# Patient Record
Sex: Female | Born: 1980 | Hispanic: Yes | Marital: Married | Smoking: Never smoker
Health system: Southern US, Community
[De-identification: ages and names within clinical notes are randomized; demographics above are authoritative.]

## PROBLEM LIST (undated history)

## (undated) DIAGNOSIS — D649 Anemia, unspecified: Secondary | ICD-10-CM

## (undated) DIAGNOSIS — M810 Age-related osteoporosis without current pathological fracture: Secondary | ICD-10-CM

## (undated) DIAGNOSIS — D219 Benign neoplasm of connective and other soft tissue, unspecified: Secondary | ICD-10-CM

## (undated) DIAGNOSIS — H53149 Visual discomfort, unspecified: Secondary | ICD-10-CM

## (undated) DIAGNOSIS — R519 Headache, unspecified: Secondary | ICD-10-CM

## (undated) DIAGNOSIS — N83209 Unspecified ovarian cyst, unspecified side: Secondary | ICD-10-CM

## (undated) HISTORY — PX: TUBAL LIGATION: SHX77

## (undated) HISTORY — PX: ABDOMINAL SURGERY: SHX537

## (undated) HISTORY — DX: Anemia, unspecified: D64.9

## (undated) HISTORY — DX: Benign neoplasm of connective and other soft tissue, unspecified: D21.9

---

## 2014-06-10 ENCOUNTER — Emergency Department: Payer: Self-pay | Admitting: Emergency Medicine

## 2014-09-15 DIAGNOSIS — N644 Mastodynia: Secondary | ICD-10-CM | POA: Insufficient documentation

## 2014-10-03 ENCOUNTER — Emergency Department: Payer: Self-pay | Admitting: Emergency Medicine

## 2015-05-12 ENCOUNTER — Emergency Department: Payer: Medicaid Other

## 2015-05-12 ENCOUNTER — Emergency Department
Admission: EM | Admit: 2015-05-12 | Discharge: 2015-05-12 | Disposition: A | Discharge status: Home / Self Care | Payer: Medicaid Other | Attending: Emergency Medicine | Admitting: Emergency Medicine

## 2015-05-12 ENCOUNTER — Encounter: Payer: Self-pay | Admitting: Emergency Medicine

## 2015-05-12 DIAGNOSIS — Z3202 Encounter for pregnancy test, result negative: Secondary | ICD-10-CM | POA: Diagnosis not present

## 2015-05-12 DIAGNOSIS — R1011 Right upper quadrant pain: Secondary | ICD-10-CM

## 2015-05-12 DIAGNOSIS — R1084 Generalized abdominal pain: Secondary | ICD-10-CM

## 2015-05-12 DIAGNOSIS — N8320 Unspecified ovarian cysts: Secondary | ICD-10-CM | POA: Diagnosis not present

## 2015-05-12 DIAGNOSIS — N83209 Unspecified ovarian cyst, unspecified side: Secondary | ICD-10-CM

## 2015-05-12 LAB — URINALYSIS COMPLETE WITH MICROSCOPIC (ARMC ONLY)
Bilirubin Urine: NEGATIVE
Glucose, UA: NEGATIVE mg/dL
HGB URINE DIPSTICK: NEGATIVE
KETONES UR: NEGATIVE mg/dL
Leukocytes, UA: NEGATIVE
NITRITE: NEGATIVE
PH: 5 (ref 5.0–8.0)
PROTEIN: NEGATIVE mg/dL
SPECIFIC GRAVITY, URINE: 1.025 (ref 1.005–1.030)

## 2015-05-12 LAB — CBC WITH DIFFERENTIAL/PLATELET
Basophils Absolute: 0 10*3/uL (ref 0–0.1)
Basophils Relative: 1 %
EOS PCT: 3 %
Eosinophils Absolute: 0.1 10*3/uL (ref 0–0.7)
HEMATOCRIT: 32.4 % — AB (ref 35.0–47.0)
Hemoglobin: 10.2 g/dL — ABNORMAL LOW (ref 12.0–16.0)
LYMPHS ABS: 1 10*3/uL (ref 1.0–3.6)
Lymphocytes Relative: 31 %
MCH: 20.9 pg — AB (ref 26.0–34.0)
MCHC: 31.4 g/dL — ABNORMAL LOW (ref 32.0–36.0)
MCV: 66.6 fL — AB (ref 80.0–100.0)
Monocytes Absolute: 0.4 10*3/uL (ref 0.2–0.9)
Monocytes Relative: 11 %
NEUTROS PCT: 54 %
Neutro Abs: 1.9 10*3/uL (ref 1.4–6.5)
Platelets: 168 10*3/uL (ref 150–440)
RBC: 4.86 MIL/uL (ref 3.80–5.20)
RDW: 17.8 % — ABNORMAL HIGH (ref 11.5–14.5)
WBC: 3.4 10*3/uL — ABNORMAL LOW (ref 3.6–11.0)

## 2015-05-12 LAB — BASIC METABOLIC PANEL
ANION GAP: 8 (ref 5–15)
BUN: 18 mg/dL (ref 6–20)
CHLORIDE: 105 mmol/L (ref 101–111)
CO2: 27 mmol/L (ref 22–32)
CREATININE: 0.86 mg/dL (ref 0.44–1.00)
Calcium: 9.4 mg/dL (ref 8.9–10.3)
GFR calc non Af Amer: >60 mL/min (ref >60)
Glucose, Bld: 98 mg/dL (ref 65–99)
POTASSIUM: 3.6 mmol/L (ref 3.5–5.1)
Sodium: 140 mmol/L (ref 135–145)

## 2015-05-12 LAB — POCT PREGNANCY, URINE: Preg Test, Ur: NEGATIVE

## 2015-05-12 MED ORDER — HYDROCODONE-ACETAMINOPHEN 5-325 MG PO TABS: 1.0000 | ORAL_TABLET | ORAL | Status: DC | PRN

## 2015-05-12 MED ORDER — METOCLOPRAMIDE HCL 5 MG/ML IJ SOLN
10.0000 mg | Freq: Once | INTRAMUSCULAR | Status: AC
  Administered 2015-05-12: 10 mg via INTRAVENOUS

## 2015-05-12 MED ORDER — HYDROMORPHONE HCL 1 MG/ML IJ SOLN
0.5000 mg | INTRAMUSCULAR | Status: DC | PRN
  Administered 2015-05-12: 0.5 mg via INTRAVENOUS

## 2015-05-12 MED ORDER — IOHEXOL 300 MG/ML  SOLN
100.0000 mL | Freq: Once | INTRAMUSCULAR | Status: AC | PRN
  Administered 2015-05-12: 100 mL via INTRAVENOUS

## 2015-05-12 MED ORDER — METOCLOPRAMIDE HCL 5 MG/ML IJ SOLN
INTRAMUSCULAR | Status: AC
Start: 2015-05-12 — End: 2015-05-12
  Administered 2015-05-12: 10 mg via INTRAVENOUS
  Filled 2015-05-12: qty 2

## 2015-05-12 MED ORDER — IOHEXOL 240 MG/ML SOLN
25.0000 mL | Freq: Once | INTRAMUSCULAR | Status: AC | PRN
  Administered 2015-05-12: 25 mL via ORAL

## 2015-05-12 MED ORDER — HYDROMORPHONE HCL 1 MG/ML IJ SOLN
INTRAMUSCULAR | Status: AC
  Administered 2015-05-12: 0.5 mg via INTRAVENOUS
  Filled 2015-05-12: qty 1

## 2015-05-12 NOTE — ED Provider Notes (Signed)
[address] [facility] Emergency Department Provider Note  ____________________________________________  Time seen: 10:10 AM  I have reviewed the triage vital signs and the nursing notes.   HISTORY  Chief Complaint Abdominal Pain     HPI Jaclyn Jackson is a 34 y.o. female who began having abdominal pain last night at approximately 9 PM. She reports the pain as being moderate. It is primarily in the center of her abdomen. She has not had pain like this before. She denies any ongoing health issues. She does have a history of a C-section and some form of a procedure on her right ovary.   She denies nausea, vomiting, or diarrhea.   History reviewed. No pertinent past medical history.  There are no active problems to display for this patient.   Past Surgical History  Procedure Laterality Date  . Abdominal surgery    . Tubal ligation    . Cesarean section      Current Outpatient Rx  Name  Route  Sig  Dispense  Refill  . HYDROcodone-acetaminophen (NORCO) 5-325 MG per tablet   Oral   Take 1 tablet by mouth every 4 (four) hours as needed for moderate pain.   10 tablet   0     Allergies Review of patient's allergies indicates no known allergies.  No family history on file.  Social History History  Substance Use Topics  . Smoking status: Never Smoker   . Smokeless tobacco: Not on file  . Alcohol Use: No    Review of Systems  Constitutional: Negative for fever. ENT: Negative for sore throat. Cardiovascular: Negative for chest pain. Respiratory: Negative for shortness of breath. Gastrointestinal: Positive for abdominal pain without nausea vomiting or diarrhea. See history of present illness Genitourinary: Negative for dysuria. Musculoskeletal: No myalgias or injuries. Skin: Negative for rash. Neurological: Negative for headaches   10-point ROS otherwise negative.  ____________________________________________   PHYSICAL EXAM:  VITAL  SIGNS: ED Triage Vitals  Enc Vitals Group     BP 05/12/15 0931 113/78 mmHg     Pulse Rate 05/12/15 0931 71     Resp 05/12/15 0931 20     Temp 05/12/15 0931 98.5 F (36.9 C)     Temp Source 05/12/15 0931 Oral     SpO2 05/12/15 0931 100 %     Weight 05/12/15 0931 130 lb (58.968 kg)     Height 05/12/15 0931 5\' 2"  (1.575 m)     Head Cir --      Peak Flow --      Pain Score 05/12/15 0932 10     Pain Loc --      Pain Edu? --      Excl. in GC? --     Constitutional:  Alert and oriented. Well appearing, but uncomfortable on exam with abdominal tenderness. ENT   Head: Normocephalic and atraumatic.   Nose: No congestion/rhinnorhea.   Mouth/Throat: Mucous membranes are moist. Cardiovascular: Normal rate, regular rhythm, no murmur noted Respiratory:  Normal respiratory effort, no tachypnea.    Breath sounds are clear and equal bilaterally.  Gastrointestinal: Soft tenderness in the right upper quadrant, left upper quadrant, right lower quadrant, and mid abdomen. The left lower abdomen appears less tender. There is no distention. Back: No muscle spasm, no tenderness, no CVA tenderness. Musculoskeletal: No deformity noted. Nontender with normal range of motion in all extremities.  No noted edema. Neurologic:  Normal speech and language. No gross focal neurologic deficits are appreciated.  Skin:  Skin  is warm, dry. No rash noted. Psychiatric: Mood and affect are normal. Speech and behavior are normal.  ____________________________________________    LABS (pertinent positives/negatives)  Urine pregnancy: Negative  ____________________________________________  ____________________________________________    RADIOLOGY Abdominal x-ray No acute findings  Right upper quadrant ultrasound IMPRESSION: Study within normal limits.  CT abdomen and pelvis:  IMPRESSION: 1. Small volume of pelvic free fluid likely is physiologic, with evidence of a collapsing adnexal cyst on the  left. 2. Normal appendix and otherwise negative CT Abdomen and Pelvis.    ____________________________________________ ____________________________________________   INITIAL IMPRESSION / ASSESSMENT AND PLAN / ED COURSE  Pertinent labs & imaging results that were available during my care of the patient were reviewed by me and considered in my medical decision making (see chart for details).  Pleasant alert 34 year old female who speaks Spanish primarily. She has been having abdominal pain that began in this early a.m. The pain is diffuse but at least in the left lower quadrant.  An ultrasound is normal. Her plain abdominal x-ray was negative.  ----------------------------------------- 2:05 PM on 05/12/2015 -----------------------------------------  I have reexamined the patient and spoke with her with the hospital interpreter.  She continues to have some tenderness in her central abdomen, but at this reexam it is more notable that it's present in the right and not in the left. I will do a CT scan to evaluate for possible appendicitis.  ----------------------------------------- 3:38 PM on 05/12/2015 -----------------------------------------  CT scan shows some free fluid in the pelvis and a collapsed adnexal cyst on the left. The appendix was normal and the remainder of the CT was normal.  We will write a prescription for hydrocodone for the patient dispense #10. We are counseling her to follow-up with GYN for ongoing concerns.  ____________________________________________   FINAL CLINICAL IMPRESSION(S) / ED DIAGNOSES  Final diagnoses:  Abdominal pain, right upper quadrant  Generalized abdominal pain  Ruptured ovarian cyst      Darien Ramus, MD 05/12/15 [phone]

## 2015-05-12 NOTE — Discharge Instructions (Signed)
Quiste ovrico (Ovarian Cyst) Un quiste ovrico es una bolsa llena de lquido que se forma en el ovario. Los ovarios son los rganos pequeos que producen vulos en las mujeres. Se pueden formar varios tipos de quistes en los ovarios. La mayora no son cancerosos. Muchos de ellos no causan problemas y con frecuencia desaparecen solos. Algunos pueden provocar sntomas y requerir tratamiento. Los tipos ms comunes de quistes ovricos son los siguientes:  Quistes funcionales: estos quistes pueden aparecer todos los meses durante el ciclo menstrual. Esto es normal. Estos quistes suelen desaparecer con el prximo ciclo menstrual si la mujer no queda embarazada. En general, los quistes funcionales no tienen sntomas.  Endometriomas: estos quistes se forman a partir del tejido que recubre el tero. Tambin se denominan "quistes de chocolate" porque se llenan de sangre que se vuelve marrn. Este tipo de quiste puede provocar dolor en la zona inferior del abdomen durante la relacin sexual y con el perodo menstrual.  Cistoadenomas: este tipo se desarrolla a partir de las clulas que se ubican en el exterior del ovario. Estos quistes pueden ser muy grandes y causar dolor en la zona inferior del abdomen y durante la relacin sexual. Este tipo de quiste puede girar sobre s mismo, cortar el suministro de sangre y causar un dolor intenso. Tambin se puede romper con facilidad y provocar mucho dolor.  Quistes dermoides: este tipo de quiste a veces se encuentra en ambos ovarios. Estos quistes pueden contener diferentes tipos de tejidos del organismo, como piel, dientes, pelo o cartlago. Generalmente no tienen sntomas, a menos que sean muy grandes.  Quistes tecalutenicos: aparecen cuando se produce demasiada cantidad de cierta hormona (gonadotropina corinica humana) que estimula en exceso al ovario para que produzca vulos. Esto es ms frecuente despus de procedimientos que ayudan a la concepcin de un beb  (fertilizacin in vitro). CAUSAS   Los medicamentos para la fertilidad pueden provocar una afeccin mediante la cual se forman mltiples quistes de gran tamao en los ovarios. Esta se denomina sndrome de hiperestimulacin ovrica.  El sndrome del ovario poliqustico es una afeccin que puede causar desequilibrios hormonales, los cuales pueden dar como resultado quistes ovricos no funcionales. SIGNOS Y SNTOMAS  Muchos quistes ovricos no causan sntomas. Si se presentan sntomas, stos pueden ser:  Dolor o molestias en la pelvis.  Dolor en la parte baja del abdomen.  Dolor durante las relaciones sexuales.  Aumento del permetro abdominal (hinchazn).  Perodos menstruales anormales.  Aumento del dolor en los perodos menstruales.  Cese de los perodos menstruales sin estar embarazada. DIAGNSTICO  Estos quistes se descubren comnmente durante un examen de rutina o una exploracin ginecolgica anual. Es posible que se ordenen otros estudios para obtener ms informacin sobre el quiste. Estos estudios pueden ser:  Ecografas.  Radiografas de la pelvis.  Tomografa computada.  Resonancia magntica.  Anlisis de sangre. TRATAMIENTO  Muchos de los quistes ovricos desaparecen por s solos, sin tratamiento. Es probable que el mdico quiera controlar el quiste regularmente durante 2 o 3meses para ver si se produce algn cambio. En el caso de las mujeres en la menopausia, es particularmente importante controlar de cerca al quiste ya que el ndice de cncer de ovario en las mujeres menopusicas es ms alto. Cuando se requiere tratamiento, este puede incluir cualquiera de los siguientes:  Un procedimiento para drenar el quiste (aspiracin). Esto se puede realizar mediante el uso de una aguja grande y una ecografa. Tambin se puede hacer a travs de un procedimiento laparoscpico, En   este procedimiento, se inserta un tubo delgado que emite luz y que tiene una pequea cmara en un  extremo (laparoscopio) a travs de una pequea incisin.  Ciruga para extirpar el quiste completo. Esto se puede realizar mediante una ciruga laparoscpica o una ciruga abierta, la cual implica realizar una incisin ms grande en la parte inferior del abdomen.  Tratamiento hormonal o pldoras anticonceptivas. Estos mtodos a veces se usan para ayudar a disolver un quiste. INSTRUCCIONES PARA EL CUIDADO EN EL HOGAR   Tome solo medicamentos de venta libre o recetados, segn las indicaciones del mdico.  Concurra a las consultas de control con su mdico segn las indicaciones.  Hgase exmenes plvicos regulares y pruebas de Papanicolaou. SOLICITE ATENCIN MDICA SI:   Los perodos se atrasan, son irregulares, dolorosos o cesan.  El dolor plvico o abdominal no desaparece.  El abdomen se agranda o se hincha.  Siente presin en la vejiga o no puede vaciarla completamente.  Siente dolor durante las relaciones sexuales.  Tiene una sensacin de hinchazn, presin o molestias en el estmago.  Pierde peso sin razn aparente.  Siente un malestar generalizado.  Est estreida.  Pierde el apetito.  Le aparece acn.  Nota un aumento del vello corporal y facial.  Aumenta de peso sin hacer modificaciones en su actividad fsica y en su dieta habitual.  Sospecha que est embarazada. SOLICITE ATENCIN MDICA DE INMEDIATO SI:   Siente cada vez ms dolor abdominal.  Tiene malestar estomacal (nuseas) y vomita.  Tiene fiebre que se presenta de manera repentina.  Siente dolor abdominal al defecar.  Sus perodos menstruales son ms abundantes que lo habitual. ASEGRESE DE QUE:   Comprende estas instrucciones.  Controlar su afeccin.  Recibir ayuda de inmediato si no mejora o si empeora. Document Released: 08/02/2005 Document Revised: 10/28/2013 [employer] Patient Information 2015 [employer]. This information is not intended to replace advice given to you by your health  care provider. Make sure you discuss any questions you have with your health care provider.  

## 2015-05-12 NOTE — ED Notes (Signed)
Patient returned to room. 

## 2015-05-12 NOTE — ED Notes (Signed)
Patient transported to Ultrasound 

## 2015-05-12 NOTE — ED Notes (Signed)
Pt presents with abd pain that started last night. Denies any n/v/d.

## 2015-06-07 ENCOUNTER — Emergency Department: Payer: Medicaid Other

## 2015-06-07 ENCOUNTER — Emergency Department
Admission: EM | Admit: 2015-06-07 | Discharge: 2015-06-07 | Disposition: A | Discharge status: Home / Self Care | Payer: Medicaid Other | Attending: Emergency Medicine | Admitting: Emergency Medicine

## 2015-06-07 ENCOUNTER — Encounter: Payer: Self-pay | Admitting: *Deleted

## 2015-06-07 DIAGNOSIS — R102 Pelvic and perineal pain: Secondary | ICD-10-CM

## 2015-06-07 DIAGNOSIS — N739 Female pelvic inflammatory disease, unspecified: Secondary | ICD-10-CM | POA: Diagnosis not present

## 2015-06-07 DIAGNOSIS — Z3202 Encounter for pregnancy test, result negative: Secondary | ICD-10-CM | POA: Diagnosis not present

## 2015-06-07 DIAGNOSIS — R1032 Left lower quadrant pain: Secondary | ICD-10-CM | POA: Diagnosis present

## 2015-06-07 LAB — CBC WITH DIFFERENTIAL/PLATELET
BASOS ABS: 0 10*3/uL (ref 0–0.1)
BASOS PCT: 1 %
Eosinophils Absolute: 0.1 10*3/uL (ref 0–0.7)
Eosinophils Relative: 3 %
HCT: 32.1 % — ABNORMAL LOW (ref 35.0–47.0)
Hemoglobin: 10.1 g/dL — ABNORMAL LOW (ref 12.0–16.0)
LYMPHS PCT: 25 %
Lymphs Abs: 1.2 10*3/uL (ref 1.0–3.6)
MCH: 21 pg — ABNORMAL LOW (ref 26.0–34.0)
MCHC: 31.4 g/dL — ABNORMAL LOW (ref 32.0–36.0)
MCV: 66.8 fL — ABNORMAL LOW (ref 80.0–100.0)
Monocytes Absolute: 0.6 10*3/uL (ref 0.2–0.9)
Monocytes Relative: 12 %
NEUTROS PCT: 59 %
Neutro Abs: 2.8 10*3/uL (ref 1.4–6.5)
PLATELETS: 191 10*3/uL (ref 150–440)
RBC: 4.81 MIL/uL (ref 3.80–5.20)
RDW: 17.9 % — AB (ref 11.5–14.5)
WBC: 4.7 10*3/uL (ref 3.6–11.0)

## 2015-06-07 LAB — URINALYSIS COMPLETE WITH MICROSCOPIC (ARMC ONLY)
Bacteria, UA: NONE SEEN
Bilirubin Urine: NEGATIVE
Glucose, UA: NEGATIVE mg/dL
Hgb urine dipstick: NEGATIVE
Ketones, ur: NEGATIVE mg/dL
Leukocytes, UA: NEGATIVE
Nitrite: NEGATIVE
PROTEIN: NEGATIVE mg/dL
RBC / HPF: NONE SEEN RBC/hpf (ref 0–5)
Specific Gravity, Urine: 1.015 (ref 1.005–1.030)
pH: 5 (ref 5.0–8.0)

## 2015-06-07 LAB — WET PREP, GENITAL
Clue Cells Wet Prep HPF POC: NONE SEEN
Trich, Wet Prep: NONE SEEN
WBC, Wet Prep HPF POC: NONE SEEN
YEAST WET PREP: NONE SEEN

## 2015-06-07 LAB — COMPREHENSIVE METABOLIC PANEL
ALK PHOS: 43 U/L (ref 38–126)
ALT: 30 U/L (ref 14–54)
AST: 27 U/L (ref 15–41)
Albumin: 4.3 g/dL (ref 3.5–5.0)
Anion gap: 6 (ref 5–15)
BUN: 11 mg/dL (ref 6–20)
CALCIUM: 9.2 mg/dL (ref 8.9–10.3)
CHLORIDE: 108 mmol/L (ref 101–111)
CO2: 28 mmol/L (ref 22–32)
CREATININE: 0.76 mg/dL (ref 0.44–1.00)
Glucose, Bld: 81 mg/dL (ref 65–99)
Potassium: 3.4 mmol/L — ABNORMAL LOW (ref 3.5–5.1)
Sodium: 142 mmol/L (ref 135–145)
Total Bilirubin: 0.4 mg/dL (ref 0.3–1.2)
Total Protein: 7.4 g/dL (ref 6.5–8.1)

## 2015-06-07 LAB — CHLAMYDIA/NGC RT PCR (ARMC ONLY)
CHLAMYDIA TR: NOT DETECTED
N GONORRHOEAE: NOT DETECTED

## 2015-06-07 MED ORDER — AZITHROMYCIN 250 MG PO TABS
1000.0000 mg | ORAL_TABLET | Freq: Once | ORAL | Status: AC
  Administered 2015-06-07: 1000 mg via ORAL
  Filled 2015-06-07: qty 4

## 2015-06-07 MED ORDER — OXYCODONE-ACETAMINOPHEN 5-325 MG PO TABS
2.0000 | ORAL_TABLET | Freq: Once | ORAL | Status: AC
  Administered 2015-06-07: 2 via ORAL
  Filled 2015-06-07: qty 2

## 2015-06-07 MED ORDER — METRONIDAZOLE 500 MG PO TABS: 500.0000 mg | ORAL_TABLET | Freq: Two times a day (BID) | ORAL | Status: DC

## 2015-06-07 MED ORDER — CEFTRIAXONE SODIUM 250 MG IJ SOLR
250.0000 mg | INTRAMUSCULAR | Status: DC
  Administered 2015-06-07: 250 mg via INTRAMUSCULAR
  Filled 2015-06-07: qty 250

## 2015-06-07 MED ORDER — DIPHENHYDRAMINE HCL 25 MG PO CAPS
ORAL_CAPSULE | ORAL | Status: AC
  Administered 2015-06-07: 25 mg via ORAL
  Filled 2015-06-07: qty 1

## 2015-06-07 MED ORDER — OXYCODONE-ACETAMINOPHEN 5-325 MG PO TABS: 1.0000 | ORAL_TABLET | Freq: Four times a day (QID) | ORAL | Status: DC | PRN

## 2015-06-07 MED ORDER — DIPHENHYDRAMINE HCL 25 MG PO CAPS
25.0000 mg | ORAL_CAPSULE | Freq: Once | ORAL | Status: AC
  Administered 2015-06-07: 25 mg via ORAL

## 2015-06-07 MED ORDER — DOXYCYCLINE HYCLATE 100 MG PO CAPS: 100.0000 mg | ORAL_CAPSULE | Freq: Two times a day (BID) | ORAL | Status: DC

## 2015-06-07 NOTE — ED Provider Notes (Signed)
[facility] Emergency Department Provider Note     Time seen: ----------------------------------------- 2:51 PM on 06/07/2015 -----------------------------------------    I have reviewed the triage vital signs and the nursing notes.   HISTORY  Chief Complaint Abdominal Pain    HPI Jaclyn Jackson is a 34 y.o. female who presents to ER for abdominal pain. Patient states she was seen earlier this month for the same thing. Pain is in the left lower quadrant, nothing makes it better or worse. Patient denies any fevers chills other complaints. Pain is sharp.   No past medical history on file.  There are no active problems to display for this patient.   Past Surgical History  Procedure Laterality Date  . Abdominal surgery    . Tubal ligation    . Cesarean section      Allergies Review of patient's allergies indicates no known allergies.  Social History History  Substance Use Topics  . Smoking status: Never Smoker   . Smokeless tobacco: Not on file  . Alcohol Use: No    Review of Systems Constitutional: Negative for fever. Eyes: Negative for visual changes. ENT: Negative for sore throat. Cardiovascular: Negative for chest pain. Respiratory: Negative for shortness of breath. Gastrointestinal: Positive for abdominal pain. Genitourinary: Negative for dysuria. Musculoskeletal: Negative for back pain. Skin: Negative for rash. Neurological: Negative for headaches, focal weakness or numbness.  10-point ROS otherwise negative.  ____________________________________________   PHYSICAL EXAM:  VITAL SIGNS: ED Triage Vitals  Enc Vitals Group     BP 06/07/15 1426 112/69 mmHg     Pulse Rate 06/07/15 1426 78     Resp 06/07/15 1426 22     Temp 06/07/15 1426 98.5 F (36.9 C)     Temp Source 06/07/15 1426 Oral     SpO2 06/07/15 1426 100 %     Weight 06/07/15 1426 128 lb (58.06 kg)     Height 06/07/15 1426  (1.575 m)     Head Cir --      Peak Flow --      Pain Score --      Pain Loc --      Pain Edu? --      Excl. in GC? --     Constitutional: Alert and oriented. Well appearing and in no distress. Eyes: Conjunctivae are normal. PERRL. Normal extraocular movements. ENT   Head: Normocephalic and atraumatic.   Nose: No congestion/rhinnorhea.   Mouth/Throat: Mucous membranes are moist.   Neck: No stridor. Cardiovascular: Normal rate, regular rhythm. Normal and symmetric distal pulses are present in all extremities. No murmurs, rubs, or gallops. Respiratory: Normal respiratory effort without tachypnea nor retractions. Breath sounds are clear and equal bilaterally. No wheezes/rales/rhonchi. Gastrointestinal: Positive for left lower quadrant abdominal pain, no rebound or guarding. Genitourinary: Copious thick white vaginal discharge, cervical motion tenderness and bilateral adnexal tenderness Musculoskeletal: Nontender with normal range of motion in all extremities. No joint effusions.  No lower extremity tenderness nor edema. Neurologic:  Normal speech and language. No gross focal neurologic deficits are appreciated. Speech is normal. No gait instability. Skin:  Skin is warm, dry and intact. No rash noted. Psychiatric: Mood and affect are normal. Speech and behavior are normal. Patient exhibits appropriate insight and judgment  ____________________________________________  ED COURSE:  Pertinent labs & imaging results that were available during my care of the patient were reviewed by me and considered in my medical decision making (see chart for details). Patient will need abdominal labs, pelvic  exam and likely ultrasound. ____________________________________________    LABS (pertinent positives/negatives)  Labs Reviewed  CBC WITH DIFFERENTIAL/PLATELET - Abnormal; Notable for the following:    Hemoglobin 10.1 (*)    HCT 32.1 (*)    MCV 66.8 (*)    MCH 21.0 (*)    MCHC 31.4 (*)    RDW 17.9 (*)    All  other components within normal limits  COMPREHENSIVE METABOLIC PANEL - Abnormal; Notable for the following:    Potassium 3.4 (*)    All other components within normal limits  URINALYSIS COMPLETEWITH MICROSCOPIC ([facility] ONLY) - Abnormal; Notable for the following:    Color, Urine YELLOW (*)    APPearance HAZY (*)    Squamous Epithelial / LPF 6-30 (*)    All other components within normal limits  CHLAMYDIA/NGC RT PCR ([facility] ONLY)  WET PREP, GENITAL  POC URINE PREG, ED    RADIOLOGY  Pelvic ultrasound IMPRESSION: 1. Normal sonographic appearance of the uterus. 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 2. Normal endometrium. 3. Nonvisualization of the right ovary. 4. Normal left ovary. 5. Small amount of free pelvic fluid.  ____________________________________________  FINAL ASSESSMENT AND PLAN  Pelvic pain, PID  Plan: Patient with labs and imaging as dictated above. Patient given Rocephin and Zithromax to cover for STDs. She'll be discharged with doxycycline and Flagyl and close follow-up with her doctor to ensure improvement.   Emily Filbert, MD   Emily Filbert, MD 06/07/15 [phone]

## 2015-06-07 NOTE — ED Notes (Signed)
Pt reports feeling itchy all over. MD notified.

## 2015-06-07 NOTE — ED Notes (Signed)
poct urine pregnancy test NEGATIVE 

## 2015-06-07 NOTE — Progress Notes (Signed)
   06/07/15 1500  Clinical Encounter Type  Visited With Patient  Visit Type Spiritual support  Spiritual Encounters  Spiritual Needs Emotional  Stress Factors  Patient Stress Factors Health changes   Status:  Faith: Catholic Family: none present, has 3 kids that are with a family friend Visit Assessment: The chaplain shared  encouraging words. The patient shared that her husband is working and she's having pain in her stomach. She said that she was cold but the nurse said she has a fever. The chaplain introduced pastoral care and the option to bring in a priest if need be as well as an interpreter but the patient did not need either, she said.  Pastoral Care: [phone] pager or submit an order online

## 2015-06-07 NOTE — ED Notes (Signed)
Pt has lower abd pain for 2 days.  No vag discharge or bleeding.  Pt has low back pain.  Denies dysuria.

## 2015-06-07 NOTE — Discharge Instructions (Signed)
Enfermedad inflamatoria plvica (Pelvic Inflammatory Disease)  La enfermedad inflamatoria plvica (PID) se refiere a una infeccin en algunos o todos de los rganos femeninos. La infeccin puede estar en el tero, en los ovarios, en las trompas de [place] o en los tejidos circundantes en la pelvis. La PID puede causar un dolor abdominal o plvico que aparece repentinamente (dolor plvico agudo). La PID es una infeccin grave, ya que puede conducir a un dolor plvico duradero (crnico) o a la imposibilidad de Best boy hijos (infertilidad).  CAUSAS  La infeccin generalmente es causada por las bacterias normales que se encuentran en los tejidos vaginales. Otras causas son las infecciones que se transmiten durante el contacto sexual. Tambin puede aparecer despus de:   El nacimiento de un beb.   Un aborto espontneo.   Un aborto inducido.   Una ciruga plvica mayor.   El uso de un dispositivo intrauterino (DIU).   Una violacin sexual.  FACTORES DE RIESGO  Ciertos factores pueden aumentar el riesgo de PID, por ejemplo:   Ser menor de 25 aos de edad.  Ser sexualmente activa a una edad temprana.  El uso de anticonceptivos que no son de barrera.  Tener mltiples compaeros sexuales.  Tener relaciones sexuales con alguien que tiene sntomas de una infeccin genital.  El uso de anticonceptivos orales. Otras veces, ciertos factores pueden aumentar la posibilidad de sufrir una PID, por ejemplo:   Tener relaciones sexuales durante la menstruacin.  Usar una ducha vaginal.  Tener un DIU (dispositivo intrauterino) en su lugar. SNTOMAS   Dolor abdominal o plvico   Fiebre.   Escalofros.   Flujo vaginal anormal.  Sangrado uterino anormal.   Dolor inusual poco despus de finalizado el perodo. DIAGNSTICO  El mdico decidir algunos de los siguientes mtodos para hacer el diagnstico:   Optometrist un examen fsico y Ardelia Mems historia clnica. El examen plvico muestra el  tero y esa zona de la pelvis muy sensibles.   Pruebas de laboratorio, como un test de [place], anlisis de [place] y de [place].  Cultivos de la vagina y del cuello uterino para Hydrographic surveyor una infeccin de transmisin sexual (ITS).  Realizar una ecografa.  Una laparoscopia para observar el interior de la pelvis.  TRATAMIENTO   Se pueden prescribir antibiticos por va oral.   Las parejas sexuales deben tratarse cuando la infeccin es [place] en una enfermedad de transmisin sexual (ETS).   Puede que necesite ser hospitalizada para aplicarle los antibiticos por va intravenosa.  En algunos casos poco frecuentes es necesaria la [place]. Pueden pasar semanas hasta la completa curacin. Si se le diagnostica una PID, tambin debern realizarle estudios para descartar el virus de inmunodeficiencia humana (VIH).  [place] los antibiticos como le indic el mdico, si se los receta. Finalice el [place], aunque comience a Sports administrator.   Slo tome medicamentos de venta libre o recetados para Glass blower/designer, las molestias o bajar la fiebre segn las indicaciones de su mdico.   No tenga relaciones sexuales hasta completar el [place], o segn las indicaciones del mdico. Si se confirma la PID, sus compaeros sexuales recientes Youth worker.   [place] con las citas tal como se le indic. SOLICITE ATENCIN MDICA SI:   Margette Fast secrecin vaginal anormal o abundante.   Necesita medicamentos recetados para Conservation officer, historic buildings.   [place].   No puede tomar los medicamentos.   Su pareja tiene una enfermedad de transmisin sexual.  Jenne Pane MDICA  DE INMEDIATO SI:   Tiene fiebre.   Aumenta el dolor abdominal o plvico.   Siente escalofros.   Siente dolor al [employer].   No mejora luego de 72 horas del tratamiento.  ASEGRESE DE QUE:   Comprende estas instrucciones.  Controlar su  enfermedad.  Solicitar ayuda de inmediato si no mejora o si empeora. Document Released: 08/02/2005 Document Revised: 02/17/2013 [facility] Patient Information 2015 [place], [place]. This information is not intended to replace advice given to you by your health care provider. Make sure you discuss any questions you have with your health care provider.  Dolor plvico  (Pelvic Pain)  Las causa del dolor plvico en la mujer pueden ser muchas y pueden tener su origen en diferentes lugares. El dolor plvico es el que aparece en la mitad inferior del abdomen y [place] caderas. Puede aparecer durante en un perodo corto de tiempo (agudo)o puede ser recurrente (crnico). Esta afeccin puede estar relacionada con trastornos que afectan a los rganos reproductivos femeninos (ginecolgica), pero tambin puede deberse a problemas en la vejiga, clculos renales, complicaciones intestinales, o problemas musculares o esquelticos. Es Garment/textile technologist ayuda de inmediato, sobre todo si ha sido intenso, [place], o ha aparecido de Wellsite geologist sbita como un dolor inusual. Tambin es importante obtener ayuda de inmediato, ya que algunos tipos de dolor plvico puede poner en peligro la vida.  CAUSAS  A continuacin veremos algunas de las causas del dolor plvico. Las causas pueden clasificarse de diferentes modos.   Ginecolgica.  Enfermedad inflamatoria plvica.  Infecciones de transmisin sexual.  Quiste de ovario o torsin de un ligamento ovrico ( torsin ovrica).  La membrana que recubre internamente al tero desarrollndose fuera del tero (endometriosis).  Fibromas, quistes o tumores.  Ovulacin.  Embarazo.  Embarazo fuera del tero (embarazo ectpico).  Aborto espontneo.  Trabajo de [place].  Desprendimiento de la placenta o ruptura del tero.  Infecciones.  Infeccin uterina (endometritis).  Infeccin de la vejiga.  Diverticulitis.  Aborto relacionado con una infeccin uterina (aborto  sptico).  Vejiga.  Inflamacin de la vejiga (cistitis).  Clculos renales.  Gastrointenstinal.  Estreimiento.  Diverticulitis.  Neurolgico.  Traumatismos.  Sentir dolor plvico debido a causas mentales o emocionales (psicosomtico).  Tumores en el intestino o en la pelvis. EVALUACIN  El mdico har una historia clnica detallada segn sus sntomas. Incluir los cambios recientes en su salud, una cuidadosa historia ginecolgica de sus periodos (menstruaciones) y Neomia Dear historia de su actividad sexual. Los antecedentes familiares y la historia clnica tambin son importantes. Su mdico podr indicar un examen plvico. El examen plvico ayudar a identificar la ubicacin y la gravedad del Engineer, mining. Tambin ayudar a [employer] rganos que pueden estar involucrados. Myrtha Mantis identificar la causa del dolor plvico y tratarlo adecuadamente, el mdico puede indicar estudios. Estas pruebas pueden ser:   Test de embarazo.  Ecografa plvica.  Radiografa del abdomen.  Un anlisis de [place] o la evaluacin de la secrecin vaginal.  Anlisis de [place]. INSTRUCCIONES PARA EL CUIDADO EN EL HOGAR   Solo tome medicamentos de venta libre o recetados para Chief Technology Officer, Dentist o fiebre, segn las indicaciones del mdico.   Haga reposo segn las indicaciones del mdico.   Consuma una dieta balanceada.   Beba gran cantidad de lquido para mantener la orina de tono claro o amarillo plido.   Evite las relaciones sexuales, Counsellor.   Aplique compresas calientes o fras en la zona baja del abdomen segn cual le calme el dolor.  Evite las situaciones estresantes.   Lleve un registro del dolor plvico. Anote cundo comenz, dnde se Optometrist y si hay cosas que parecen estar asociadas con el dolor, como algn alimento o su ciclo menstrual.  Concurra a las visitas de control con el mdico, segn las indicaciones.  SOLICITE ATENCIN MDICA SI:   Los medicamentos no  Editor, commissioning.  Tiene flujo vaginal anormal. SOLICITE ATENCIN MDICA DE INMEDIATO SI:   Tiene un sangrado abundante por la vagina.   El dolor plvico aumenta.   Se siente mareada o sufre un desmayo.   Siente escalofros.   Siente dolor intenso al Geographical information systems officer u observa sangre en la orina.   Tiene diarrea o vmitos que no puede controlar.   Tiene fiebre o sntomas que persisten durante ms de [address]  [address].  Tiene fiebre y los sntomas [address].   Ha sido abusada fsica o sexualmente.  ASEGRESE DE QUE:   Comprende estas instrucciones.  Controlar su enfermedad.  Solicitar ayuda de inmediato si no mejora o si empeora. Document Released: 01/19/2009 Document Revised: 03/09/2014 [facility] Patient Information 2015 [place], [place]. This information is not intended to replace advice given to you by your health care provider. Make sure you discuss any questions you have with your health care provider.

## 2015-06-08 LAB — POCT PREGNANCY, URINE: Preg Test, Ur: NEGATIVE

## 2015-06-17 ENCOUNTER — Other Ambulatory Visit: Payer: Self-pay | Admitting: Family Medicine

## 2015-06-17 DIAGNOSIS — N83209 Unspecified ovarian cyst, unspecified side: Secondary | ICD-10-CM

## 2018-02-11 ENCOUNTER — Emergency Department
Admission: EM | Admit: 2018-02-11 | Discharge: 2018-02-11 | Disposition: A | Discharge status: Home / Self Care | Payer: Commercial Managed Care - PPO | Attending: Emergency Medicine | Admitting: Emergency Medicine

## 2018-02-11 ENCOUNTER — Encounter: Payer: Self-pay | Admitting: Emergency Medicine

## 2018-02-11 ENCOUNTER — Emergency Department: Payer: Commercial Managed Care - PPO

## 2018-02-11 ENCOUNTER — Other Ambulatory Visit: Payer: Self-pay

## 2018-02-11 DIAGNOSIS — Z79899 Other long term (current) drug therapy: Secondary | ICD-10-CM | POA: Insufficient documentation

## 2018-02-11 DIAGNOSIS — G43001 Migraine without aura, not intractable, with status migrainosus: Secondary | ICD-10-CM | POA: Diagnosis not present

## 2018-02-11 DIAGNOSIS — R51 Headache: Secondary | ICD-10-CM | POA: Diagnosis present

## 2018-02-11 MED ORDER — SODIUM CHLORIDE 0.9 % IV BOLUS
1000.0000 mL | Freq: Once | INTRAVENOUS | Status: AC
  Administered 2018-02-11: 1000 mL via INTRAVENOUS

## 2018-02-11 MED ORDER — KETOROLAC TROMETHAMINE 30 MG/ML IJ SOLN
30.0000 mg | Freq: Once | INTRAMUSCULAR | Status: AC
  Administered 2018-02-11: 30 mg via INTRAVENOUS
  Filled 2018-02-11: qty 1

## 2018-02-11 MED ORDER — ONDANSETRON HCL 4 MG/2ML IJ SOLN
4.0000 mg | Freq: Once | INTRAMUSCULAR | Status: AC
  Administered 2018-02-11: 4 mg via INTRAVENOUS
  Filled 2018-02-11: qty 2

## 2018-02-11 MED ORDER — METOCLOPRAMIDE HCL 5 MG/ML IJ SOLN
20.0000 mg | Freq: Once | INTRAVENOUS | Status: AC
  Administered 2018-02-11: 20 mg via INTRAVENOUS
  Filled 2018-02-11: qty 4

## 2018-02-11 MED ORDER — BUTALBITAL-APAP-CAFFEINE 50-325-40 MG PO TABS: 1.0000 | ORAL_TABLET | Freq: Four times a day (QID) | ORAL | 0 refills | Status: AC | PRN

## 2018-02-11 MED ORDER — DIPHENHYDRAMINE HCL 50 MG/ML IJ SOLN
25.0000 mg | Freq: Once | INTRAMUSCULAR | Status: AC
  Administered 2018-02-11: 25 mg via INTRAVENOUS
  Filled 2018-02-11: qty 1

## 2018-02-11 NOTE — ED Triage Notes (Signed)
Headache since Saturday.  Worse than a usual headache

## 2018-02-11 NOTE — Discharge Instructions (Addendum)
Advised to follow-up family doctor for continued care.

## 2018-02-11 NOTE — ED Provider Notes (Signed)
[facility] Emergency Department Provider Note   ____________________________________________   First MD Initiated Contact with Patient 02/11/18 1328     (approximate)  I have reviewed the triage vital signs and the nursing notes.   HISTORY Via interpreter Chief Complaint Headache    HPI Jaclyn Jackson is a 37 y.o. female patient complained of severe headache which started 2 days ago.  Patient is a worse headache she hit her head.  Patient states she has a history of migraine 1-2 times a year.  She states this headache feels different.  She feels like needles pressure and fireworks going off in her head.  Patient rates the pain as a 10/10.  Patient states she does have photophobia and nausea.  Patient denies vertigo or weakness.  Patient had no relief with over-the-counter migraine preparations.  History reviewed. No pertinent past medical history.  There are no active problems to display for this patient.   Past Surgical History:  Procedure Laterality Date  . ABDOMINAL SURGERY    . CESAREAN SECTION    . TUBAL LIGATION      Prior to Admission medications   Medication Sig Start Date End Date Taking? Authorizing Provider  butalbital-acetaminophen-caffeine (FIORICET, ESGIC) 50-325-40 MG tablet Take 1-2 tablets by mouth every 6 (six) hours as needed for headache. 02/11/18 02/11/19  Joni ReiningSmith, Isobel Eisenhuth K, PA-C  doxycycline (VIBRAMYCIN) 100 MG capsule Take 1 capsule (100 mg total) by mouth 2 (two) times daily. 06/07/15   Emily FilbertWilliams, Jonathan E, MD  HYDROcodone-acetaminophen (NORCO) 5-325 MG per tablet Take 1 tablet by mouth every 4 (four) hours as needed for moderate pain. 05/12/15   Darien RamusKaminski, David W, MD  metroNIDAZOLE (FLAGYL) 500 MG tablet Take 1 tablet (500 mg total) by mouth 2 (two) times daily. 06/07/15   Emily FilbertWilliams, Jonathan E, MD  oxyCODONE-acetaminophen (ROXICET) 5-325 MG per tablet Take 1 tablet by mouth every 6 (six) hours as needed. 06/07/15   Emily FilbertWilliams, Jonathan E,  MD    Allergies Acetaminophen  No family history on file.  Social History Social History   Tobacco Use  . Smoking status: Never Smoker  . Smokeless tobacco: Never Used  Substance Use Topics  . Alcohol use: No  . Drug use: Not on file    Review of Systems Constitutional: No fever/chills Eyes: Light sensitivity.   ENT: No sore throat. Cardiovascular: Denies chest pain. Respiratory: Denies shortness of breath. Gastrointestinal: No abdominal pain.  Nausea without vomiting.  No diarrhea.  No constipation. Genitourinary: Negative for dysuria. Musculoskeletal: Negative for back pain. Skin: Negative for rash. Neurological: Positive for headaches, but denies focal weakness or numbness. ____________________________________________   PHYSICAL EXAM:  VITAL SIGNS: ED Triage Vitals  Enc Vitals Group     BP 02/11/18 1217 115/75     Pulse Rate 02/11/18 1217 67     Resp 02/11/18 1217 14     Temp 02/11/18 1217 98.2 F (36.8 C)     Temp Source 02/11/18 1217 Oral     SpO2 02/11/18 1217 100 %     Weight 02/11/18 1218 130 lb (59 kg)     Height 02/11/18 1218 5\' 2"  (1.575 m)     Head Circumference --      Peak Flow --      Pain Score --      Pain Loc --      Pain Edu? --      Excl. in GC? --    Constitutional: Alert and oriented. Well appearing and  in no acute distress. Eyes:  exam limited secondary to photophobia..   Head: Atraumatic. Hematological/Lymphatic/Immunilogical: No cervical lymphadenopathy. Cardiovascular: Normal rate, regular rhythm. Grossly normal heart sounds.  Good peripheral circulation. Respiratory: Normal respiratory effort.  No retractions. Lungs CTAB. eurologic:  Normal speech and language. No gross focal neurologic deficits are appreciated. No gait instability. Skin:  Skin is warm, dry and intact. No rash noted. Psychiatric: Mood and affect are normal. Speech and behavior are normal.  ____________________________________________   LABS (all labs  ordered are listed, but only abnormal results are displayed)  Labs Reviewed - No data to display ____________________________________________  EKG   ____________________________________________  RADIOLOGY  ED MD interpretation:    Official radiology report(s): Ct Head Wo Contrast  Result Date: 02/11/2018 CLINICAL DATA:  Severe headache for 3 days, photophobia. History of headaches. EXAM: CT HEAD WITHOUT CONTRAST TECHNIQUE: Contiguous axial images were obtained from the base of the skull through the vertex without intravenous contrast. COMPARISON:  None. FINDINGS: BRAIN: No intraparenchymal hemorrhage, mass effect nor midline shift. The ventricles and sulci are normal. No acute large vascular territory infarcts. No abnormal extra-axial fluid collections. Basal cisterns are patent. 3 mm pineal cyst. VASCULAR: Unremarkable. SKULL/SOFT TISSUES: No skull fracture. No significant soft tissue swelling. ORBITS/SINUSES: The included ocular globes and orbital contents are normal.The mastoid aircells and included paranasal sinuses are well-aerated. OTHER: None. IMPRESSION: Normal noncontrast CT HEAD. Electronically Signed   By: Awilda Metro M.D.   On: 02/11/2018 13:47    ____________________________________________   PROCEDURES  Procedure(s) performed: None  Procedures  Critical Care performed: No  ____________________________________________   INITIAL IMPRESSION / ASSESSMENT AND PLAN / ED COURSE  As part of my medical decision making, I reviewed the following data within the electronic MEDICAL RECORD NUMBER    Patient presented with 2 days of increasing headache.  Patient has a history of migraine headaches but states this is worse than usual headaches. Discussed net Head CT result. Patient responded well to IV re-hydration and Benadryl, Reglan, Toradol, and Zofran. Advised to follow up with PCP.      ____________________________________________   FINAL CLINICAL IMPRESSION(S) /  ED DIAGNOSES  Final diagnoses:  Migraine without aura and with status migrainosus, not intractable     ED Discharge Orders        Ordered    butalbital-acetaminophen-caffeine (FIORICET, ESGIC) 50-325-40 MG tablet  Every 6 hours PRN     02/11/18 1502       Note:  This document was prepared using Dragon voice recognition software and may include unintentional dictation errors.    Joni Reining, PA-C 02/25/18 1610    Minna Antis, MD 03/03/18 2132

## 2018-02-11 NOTE — ED Notes (Signed)
Patient transported to CT 

## 2018-02-11 NOTE — ED Notes (Addendum)
See triage note  Presents with headache since Saturday    States she has a hx of migraines and usually taken ibu with relief  Last ibu was at 1030 this am but states no relief today  Positive photosenstive   Denies any n/v/d/ ,fever or trauma   Informed this nurse that pain is to right side of head  And feels like "needles" in head

## 2018-11-22 ENCOUNTER — Other Ambulatory Visit: Payer: Self-pay

## 2018-11-22 ENCOUNTER — Emergency Department
Admission: EM | Admit: 2018-11-22 | Discharge: 2018-11-22 | Disposition: A | Discharge status: Home / Self Care | Payer: Commercial Managed Care - PPO | Attending: Emergency Medicine | Admitting: Emergency Medicine

## 2018-11-22 ENCOUNTER — Emergency Department: Payer: Commercial Managed Care - PPO

## 2018-11-22 ENCOUNTER — Encounter: Payer: Self-pay | Admitting: Emergency Medicine

## 2018-11-22 DIAGNOSIS — R102 Pelvic and perineal pain: Secondary | ICD-10-CM

## 2018-11-22 DIAGNOSIS — N898 Other specified noninflammatory disorders of vagina: Secondary | ICD-10-CM | POA: Diagnosis not present

## 2018-11-22 DIAGNOSIS — N83202 Unspecified ovarian cyst, left side: Secondary | ICD-10-CM | POA: Insufficient documentation

## 2018-11-22 LAB — CBC
HEMATOCRIT: 35.9 % — AB (ref 36.0–46.0)
HEMOGLOBIN: 11 g/dL — AB (ref 12.0–15.0)
MCH: 23.6 pg — ABNORMAL LOW (ref 26.0–34.0)
MCHC: 30.6 g/dL (ref 30.0–36.0)
MCV: 76.9 fL — AB (ref 80.0–100.0)
Platelets: 210 10*3/uL (ref 150–400)
RBC: 4.67 MIL/uL (ref 3.87–5.11)
RDW: 15.7 % — ABNORMAL HIGH (ref 11.5–15.5)
WBC: 5.4 10*3/uL (ref 4.0–10.5)
nRBC: 0 % (ref 0.0–0.2)

## 2018-11-22 LAB — URINALYSIS, COMPLETE (UACMP) WITH MICROSCOPIC
BACTERIA UA: NONE SEEN
BILIRUBIN URINE: NEGATIVE
Glucose, UA: NEGATIVE mg/dL
HGB URINE DIPSTICK: NEGATIVE
Ketones, ur: NEGATIVE mg/dL
Leukocytes, UA: NEGATIVE
NITRITE: NEGATIVE
Protein, ur: 30 mg/dL — AB
SPECIFIC GRAVITY, URINE: 1.03 (ref 1.005–1.030)
pH: 5 (ref 5.0–8.0)

## 2018-11-22 LAB — WET PREP, GENITAL
SPERM: NONE SEEN
TRICH WET PREP: NONE SEEN
YEAST WET PREP: NONE SEEN

## 2018-11-22 LAB — BASIC METABOLIC PANEL
Anion gap: 5 (ref 5–15)
BUN: 20 mg/dL (ref 6–20)
CHLORIDE: 105 mmol/L (ref 98–111)
CO2: 28 mmol/L (ref 22–32)
CREATININE: 0.86 mg/dL (ref 0.44–1.00)
Calcium: 9.3 mg/dL (ref 8.9–10.3)
GFR calc Af Amer: >60 mL/min (ref >60)
GFR calc non Af Amer: >60 mL/min (ref >60)
Glucose, Bld: 87 mg/dL (ref 70–99)
POTASSIUM: 3.9 mmol/L (ref 3.5–5.1)
Sodium: 138 mmol/L (ref 135–145)

## 2018-11-22 LAB — CHLAMYDIA/NGC RT PCR (ARMC ONLY)
Chlamydia Tr: NOT DETECTED
N gonorrhoeae: NOT DETECTED

## 2018-11-22 LAB — POCT PREGNANCY, URINE: Preg Test, Ur: NEGATIVE

## 2018-11-22 MED ORDER — OXYCODONE-ACETAMINOPHEN 5-325 MG PO TABS
2.0000 | ORAL_TABLET | Freq: Once | ORAL | Status: AC
  Administered 2018-11-22: 2 via ORAL
  Filled 2018-11-22: qty 2

## 2018-11-22 MED ORDER — DIPHENHYDRAMINE HCL 25 MG PO CAPS
ORAL_CAPSULE | ORAL | Status: AC
  Filled 2018-11-22: qty 1

## 2018-11-22 MED ORDER — DOXYCYCLINE HYCLATE 100 MG PO CAPS: 100.0000 mg | ORAL_CAPSULE | Freq: Two times a day (BID) | ORAL | 0 refills | Status: DC

## 2018-11-22 MED ORDER — OXYCODONE HCL 5 MG PO TABS: 5.0000 mg | ORAL_TABLET | Freq: Three times a day (TID) | ORAL | 0 refills | Status: AC | PRN

## 2018-11-22 MED ORDER — METRONIDAZOLE 500 MG PO TABS: 500.0000 mg | ORAL_TABLET | Freq: Three times a day (TID) | ORAL | 0 refills | Status: DC

## 2018-11-22 MED ORDER — OXYCODONE HCL 5 MG PO TABS: 5.0000 mg | ORAL_TABLET | Freq: Once | ORAL | Status: DC

## 2018-11-22 MED ORDER — LIDOCAINE HCL (PF) 1 % IJ SOLN
INTRAMUSCULAR | Status: AC
  Administered 2018-11-22: 0.9 mL
  Filled 2018-11-22: qty 5

## 2018-11-22 MED ORDER — DIPHENHYDRAMINE HCL 25 MG PO CAPS
25.0000 mg | ORAL_CAPSULE | Freq: Once | ORAL | Status: AC
  Administered 2018-11-22: 25 mg via ORAL

## 2018-11-22 MED ORDER — CEFTRIAXONE SODIUM 250 MG IJ SOLR
250.0000 mg | INTRAMUSCULAR | Status: DC
  Administered 2018-11-22: 250 mg via INTRAMUSCULAR
  Filled 2018-11-22: qty 250

## 2018-11-22 MED ORDER — AZITHROMYCIN 500 MG PO TABS
1000.0000 mg | ORAL_TABLET | Freq: Once | ORAL | Status: AC
  Administered 2018-11-22: 1000 mg via ORAL
  Filled 2018-11-22: qty 2

## 2018-11-22 NOTE — ED Triage Notes (Signed)
Pt here with c/o left lower abd pain that began around 10am. States she has been diagnosed in the past with ovarian cysts and this pain is the same. Walking slow to triage, NAD.

## 2018-11-22 NOTE — ED Notes (Signed)
Patient transported to Ultrasound 

## 2018-11-22 NOTE — ED Provider Notes (Signed)
[facility] Emergency Department Provider Note       Time seen: ----------------------------------------- 2:19 PM on 11/22/2018 -----------------------------------------   I have reviewed the triage vital signs and the nursing notes.  HISTORY   Chief Complaint Abdominal Pain    HPI Jaclyn Jackson is a 38 y.o. female with no significant past medical history who presents to the ED for near left lower abdominal pain that began around 10 AM.  Patient has been diagnosed with ovarian cyst in the past and the pain is similar.  She denies any vaginal discharge or bleeding.  She denies fevers, chills or other complaints.  History reviewed. No pertinent past medical history.  There are no active problems to display for this patient.   Past Surgical History:  Procedure Laterality Date  . ABDOMINAL SURGERY    . CESAREAN SECTION    . TUBAL LIGATION      Allergies Acetaminophen  Social History Social History   Tobacco Use  . Smoking status: Never Smoker  . Smokeless tobacco: Never Used  Substance Use Topics  . Alcohol use: No  . Drug use: Not on file   Review of Systems Constitutional: Negative for fever. Cardiovascular: Negative for chest pain. Respiratory: Negative for shortness of breath. Gastrointestinal: Positive for pelvic pain Genitourinary: Negative for dysuria. Musculoskeletal: Negative for back pain. Skin: Negative for rash. Neurological: Negative for headaches, focal weakness or numbness.  All systems negative/normal/unremarkable except as stated in the HPI  ____________________________________________   PHYSICAL EXAM:  VITAL SIGNS: ED Triage Vitals [11/22/18 1221]  Enc Vitals Group     BP 114/68     Pulse Rate 80     Resp 18     Temp 98.2 F (36.8 C)     Temp Source Oral     SpO2 100 %     Weight 140 lb (63.5 kg)     Height 5\' 2"  (1.575 m)     Head Circumference      Peak Flow      Pain Score 10     Pain Loc    Pain Edu?      Excl. in GC?    Constitutional: Alert and oriented. Well appearing and in no distress. Eyes: Conjunctivae are normal. Normal extraocular movements. ENT      Head: Normocephalic and atraumatic.      Nose: No congestion/rhinnorhea.      Mouth/Throat: Mucous membranes are moist.      Neck: No stridor. Cardiovascular: Normal rate, regular rhythm. No murmurs, rubs, or gallops. Respiratory: Normal respiratory effort without tachypnea nor retractions. Breath sounds are clear and equal bilaterally. No wheezes/rales/rhonchi. Gastrointestinal: Lower quadrant tenderness, no rebound or guarding.  Normal bowel sounds. Genitourinary: Copious white frothy vaginal discharge Musculoskeletal: Nontender with normal range of motion in extremities. No lower extremity tenderness nor edema. Neurologic:  Normal speech and language. No gross focal neurologic deficits are appreciated.  Skin:  Skin is warm, dry and intact. No rash noted. Psychiatric: Mood and affect are normal. Speech and behavior are normal.  ____________________________________________  ED COURSE:  As part of my medical decision making, I reviewed the following data within the electronic MEDICAL RECORD NUMBER History obtained from family if available, nursing notes, old chart and ekg, as well as notes from prior ED visits. Patient presented for pelvic pain, we will assess with labs and imaging as indicated at this time.   Procedures ____________________________________________   LABS (pertinent positives/negatives)  Labs Reviewed  WET PREP, GENITAL -  Abnormal; Notable for the following components:      Result Value   Clue Cells Wet Prep HPF POC PRESENT (*)    WBC, Wet Prep HPF POC FEW (*)    All other components within normal limits  URINALYSIS, COMPLETE (UACMP) WITH MICROSCOPIC - Abnormal; Notable for the following components:   Color, Urine YELLOW (*)    APPearance HAZY (*)    Protein, ur 30 (*)    All other components  within normal limits  CBC - Abnormal; Notable for the following components:   Hemoglobin 11.0 (*)    HCT 35.9 (*)    MCV 76.9 (*)    MCH 23.6 (*)    RDW 15.7 (*)    All other components within normal limits  CHLAMYDIA/NGC RT PCR ([facility] ONLY)  BASIC METABOLIC PANEL  POCT PREGNANCY, URINE    RADIOLOGY Images were viewed by me  Pelvic ultrasound IMPRESSION: No acute abnormality identified.  Uterine fibroid.  Left ovarian cyst. No evidence of ovarian torsion.  Right ovary is not visualized. ____________________________________________   DIFFERENTIAL DIAGNOSIS   PID, ovarian cyst, ovarian torsion, UTI, pyelonephritis  FINAL ASSESSMENT AND PLAN  Pelvic pain, vaginal discharge, ovarian cyst   Plan: The patient had presented for pelvic pain. Patient's labs are reassuring. Patient's imaging did reveal a small left-sided cyst but exam is more consistent with pelvic infection.  She was given Rocephin and Zithromax here.  She be discharged with doxycycline, Flagyl and pain medicine.   Ulice Dash, MD    Note: This note was generated in part or whole with voice recognition software. Voice recognition is usually quite accurate but there are transcription errors that can and very often do occur. I apologize for any typographical errors that were not detected and corrected.     Emily Filbert, MD 11/22/18 [phone]

## 2020-01-25 ENCOUNTER — Emergency Department: Payer: Commercial Managed Care - PPO

## 2020-01-25 ENCOUNTER — Emergency Department
Admission: EM | Admit: 2020-01-25 | Discharge: 2020-01-25 | Disposition: A | Discharge status: Home / Self Care | Payer: Commercial Managed Care - PPO | Attending: Emergency Medicine | Admitting: Emergency Medicine

## 2020-01-25 ENCOUNTER — Other Ambulatory Visit: Payer: Self-pay

## 2020-01-25 ENCOUNTER — Ambulatory Visit: Payer: Commercial Managed Care - PPO

## 2020-01-25 DIAGNOSIS — R102 Pelvic and perineal pain: Secondary | ICD-10-CM | POA: Diagnosis present

## 2020-01-25 LAB — URINALYSIS, COMPLETE (UACMP) WITH MICROSCOPIC
Bilirubin Urine: NEGATIVE
Glucose, UA: NEGATIVE mg/dL
Hgb urine dipstick: NEGATIVE
Ketones, ur: NEGATIVE mg/dL
Leukocytes,Ua: NEGATIVE
Nitrite: NEGATIVE
Protein, ur: NEGATIVE mg/dL
Specific Gravity, Urine: 1.026 (ref 1.005–1.030)
Squamous Epithelial / HPF: >50 — ABNORMAL HIGH (ref 0–5)
pH: 5 (ref 5.0–8.0)

## 2020-01-25 LAB — COMPREHENSIVE METABOLIC PANEL
ALT: 36 U/L (ref 0–44)
AST: 24 U/L (ref 15–41)
Albumin: 4.4 g/dL (ref 3.5–5.0)
Alkaline Phosphatase: 52 U/L (ref 38–126)
Anion gap: 8 (ref 5–15)
BUN: 23 mg/dL — ABNORMAL HIGH (ref 6–20)
CO2: 24 mmol/L (ref 22–32)
Calcium: 9.6 mg/dL (ref 8.9–10.3)
Chloride: 106 mmol/L (ref 98–111)
Creatinine, Ser: 0.87 mg/dL (ref 0.44–1.00)
GFR calc Af Amer: >60 mL/min (ref >60)
GFR calc non Af Amer: >60 mL/min (ref >60)
Glucose, Bld: 119 mg/dL — ABNORMAL HIGH (ref 70–99)
Potassium: 3.5 mmol/L (ref 3.5–5.1)
Sodium: 138 mmol/L (ref 135–145)
Total Bilirubin: 0.6 mg/dL (ref 0.3–1.2)
Total Protein: 7.4 g/dL (ref 6.5–8.1)

## 2020-01-25 LAB — CBC
HCT: 34.5 % — ABNORMAL LOW (ref 36.0–46.0)
Hemoglobin: 10 g/dL — ABNORMAL LOW (ref 12.0–15.0)
MCH: 20.6 pg — ABNORMAL LOW (ref 26.0–34.0)
MCHC: 29 g/dL — ABNORMAL LOW (ref 30.0–36.0)
MCV: 71.1 fL — ABNORMAL LOW (ref 80.0–100.0)
Platelets: 191 10*3/uL (ref 150–400)
RBC: 4.85 MIL/uL (ref 3.87–5.11)
RDW: 17.3 % — ABNORMAL HIGH (ref 11.5–15.5)
WBC: 3.6 10*3/uL — ABNORMAL LOW (ref 4.0–10.5)
nRBC: 0 % (ref 0.0–0.2)

## 2020-01-25 LAB — CHLAMYDIA/NGC RT PCR (ARMC ONLY)
Chlamydia Tr: NOT DETECTED
N gonorrhoeae: NOT DETECTED

## 2020-01-25 LAB — LIPASE, BLOOD: Lipase: 22 U/L (ref 11–51)

## 2020-01-25 LAB — WET PREP, GENITAL
Clue Cells Wet Prep HPF POC: NONE SEEN
Sperm: NONE SEEN
Trich, Wet Prep: NONE SEEN
Yeast Wet Prep HPF POC: NONE SEEN

## 2020-01-25 LAB — POCT PREGNANCY, URINE: Preg Test, Ur: NEGATIVE

## 2020-01-25 MED ORDER — IOHEXOL 300 MG/ML  SOLN
100.0000 mL | Freq: Once | INTRAMUSCULAR | Status: AC | PRN
  Administered 2020-01-25: 100 mL via INTRAVENOUS
  Filled 2020-01-25: qty 100

## 2020-01-25 MED ORDER — OXYCODONE HCL 5 MG PO TABS
5.0000 mg | ORAL_TABLET | Freq: Once | ORAL | Status: AC
  Administered 2020-01-25: 5 mg via ORAL
  Filled 2020-01-25: qty 1

## 2020-01-25 MED ORDER — DICYCLOMINE HCL 10 MG PO CAPS: 10.0000 mg | ORAL_CAPSULE | Freq: Four times a day (QID) | ORAL | 0 refills | Status: DC | PRN

## 2020-01-25 NOTE — ED Notes (Signed)
Pt to US.

## 2020-01-25 NOTE — ED Notes (Signed)
This RN present during pelvic exam with EDP  

## 2020-01-25 NOTE — ED Provider Notes (Signed)
[facility] Emergency Department Provider Note  ____________________________________________  Time seen: Approximately 12:49 PM  I have reviewed the triage vital signs and the nursing notes.   HISTORY  Chief Complaint Abdominal Pain and Pelvic Pain    HPI Jaclyn Jackson is a 39 y.o. female who presents to the emergency department for treatment of left lower quadrant pelvic pain that started 2 days ago. No urinary symptoms. No vaginal bleeding. No vaginal discharge. She states that she has had ovarian cysts in the past. No surgical intervention required as they "burst on their own." LMP was 2 weeks ago. Surgical history of tubal ligation. No alleviating measures attempted prior to arrival. No nausea or vomiting.  Interpreter utilized for interview and exam.  History reviewed. No pertinent past medical history.  There are no problems to display for this patient.   Past Surgical History:  Procedure Laterality Date  . ABDOMINAL SURGERY    . CESAREAN SECTION    . TUBAL LIGATION      Prior to Admission medications   Medication Sig Start Date End Date Taking? Authorizing Provider  dicyclomine (BENTYL) 10 MG capsule Take 1 capsule (10 mg total) by mouth 4 (four) times daily as needed for up to 14 days for spasms. 01/25/20 02/08/20  Chun Sellen, Rulon Eisenmenger B, FNP  doxycycline (VIBRAMYCIN) 100 MG capsule Take 1 capsule (100 mg total) by mouth 2 (two) times daily. 06/07/15   Emily Filbert, MD  doxycycline (VIBRAMYCIN) 100 MG capsule Take 1 capsule (100 mg total) by mouth 2 (two) times daily. 11/22/18   Emily Filbert, MD  HYDROcodone-acetaminophen (NORCO) 5-325 MG per tablet Take 1 tablet by mouth every 4 (four) hours as needed for moderate pain. 05/12/15   Darien Ramus, MD  metroNIDAZOLE (FLAGYL) 500 MG tablet Take 1 tablet (500 mg total) by mouth 2 (two) times daily. 06/07/15   Emily Filbert, MD  metroNIDAZOLE (FLAGYL) 500 MG tablet Take 1 tablet (500 mg  total) by mouth 3 (three) times daily. 11/22/18   Emily Filbert, MD  oxyCODONE-acetaminophen (ROXICET) 5-325 MG per tablet Take 1 tablet by mouth every 6 (six) hours as needed. 06/07/15   Emily Filbert, MD    Allergies Acetaminophen  History reviewed. No pertinent family history.  Social History Social History   Tobacco Use  . Smoking status: Never Smoker  . Smokeless tobacco: Never Used  Substance Use Topics  . Alcohol use: No  . Drug use: Not on file    Review of Systems Constitutional: Negative for fever. Respiratory: Negative for shortness of breath or cough. Gastrointestinal: Positive for abdominal pain; negative for nausea , negative for vomiting. Genitourinary: Negative for dysuria , negative for vaginal discharge. Musculoskeletal: Negative for back pain. Skin: Negative for acute skin changes/rash/lesion. ____________________________________________   PHYSICAL EXAM:  VITAL SIGNS: ED Triage Vitals  Enc Vitals Group     BP 01/25/20 1105 121/70     Pulse Rate 01/25/20 1105 83     Resp 01/25/20 1105 16     Temp 01/25/20 1105 98.7 F (37.1 C)     Temp Source 01/25/20 1105 Oral     SpO2 01/25/20 1105 100 %     Weight 01/25/20 1106 145 lb (65.8 kg)     Height 01/25/20 1106 5\' 2"  (1.575 m)     Head Circumference --      Peak Flow --      Pain Score 01/25/20 1106 10     Pain Loc --  Pain Edu? --      Excl. in [place]? --     Constitutional: Alert and oriented. Well appearing and in no acute distress. Eyes: Conjunctivae are normal. Head: Atraumatic. Nose: No congestion/rhinnorhea. Mouth/Throat: Mucous membranes are moist. Respiratory: Normal respiratory effort.  No retractions. Gastrointestinal: Bowel sounds active x 4; LLQ tenderness on exam with radiation into left mid and upper abdomen. Genitourinary: Pelvic exam: Lubrication from TV US in vaginal vault and on cervix. No cervical motion tenderness or discharge from cervical os. Musculoskeletal: No  extremity tenderness nor edema.  Neurologic:  Normal speech and language. No gross focal neurologic deficits are appreciated. Speech is normal. No gait instability. Skin:  Skin is warm, dry and intact. No rash noted on exposed skin. Psychiatric: Mood and affect are normal. Speech and behavior are normal.  ____________________________________________   LABS (all labs ordered are listed, but only abnormal results are displayed)  Labs Reviewed  WET PREP, GENITAL - Abnormal; Notable for the following components:      Result Value   WBC, Wet Prep HPF POC FEW (*)    All other components within normal limits  COMPREHENSIVE METABOLIC PANEL - Abnormal; Notable for the following components:   Glucose, Bld 119 (*)    BUN 23 (*)    All other components within normal limits  CBC - Abnormal; Notable for the following components:   WBC 3.6 (*)    Hemoglobin 10.0 (*)    HCT 34.5 (*)    MCV 71.1 (*)    MCH 20.6 (*)    MCHC 29.0 (*)    RDW 17.3 (*)    All other components within normal limits  URINALYSIS, COMPLETE (UACMP) WITH MICROSCOPIC - Abnormal; Notable for the following components:   Color, Urine YELLOW (*)    APPearance CLOUDY (*)    Bacteria, UA FEW (*)    Squamous Epithelial / LPF >50 (*)    All other components within normal limits  CHLAMYDIA/NGC RT PCR ([facility] ONLY)  LIPASE, BLOOD  POC URINE PREG, ED  POCT PREGNANCY, URINE   ____________________________________________  RADIOLOGY  Ultrasound and CT are both negative for acute findings. ____________________________________________  Procedures  ____________________________________________  39 year old female presenting to the emergency department for treatment and evaluation of left pelvic pain.  She is exquisitely tender over the left lower quadrant and some in the left mid to upper quadrant. Protocol labs are reassuring.  She does have a mild leukocytosis at 3.6 but upon review of her records this is near her  baseline.  Hemoglobin is noted to be 10.0 which again is at or near baseline.  US abdomen and pelvis with transvaginal images unremarkable. Will perform pelvic exam. If normal, will CT abdomen and pelvis as she does appear to be uncomfortable and has focal tenderness.  Pelvic exam unremarkable. Will order CT. Patient states pain is fairly well controlled after pain medication.   CT of the abdomen and pelvis is normal. Plan will be to discharge her home and have her follow up with primary care.   Pertinent labs & imaging results that were available during my care of the patient were reviewed by me and considered in my medical decision making (see chart for details).  ____________________________________________   FINAL CLINICAL IMPRESSION(S) / ED DIAGNOSES  Final diagnoses:  Pelvic pain    Note:  This document was prepared using Dragon voice recognition software and may include unintentional dictation errors.   Victorino Dike, FNP 01/25/20 1944    Quale,  Loraine Leriche, MD 01/26/20 1302

## 2020-01-25 NOTE — ED Triage Notes (Signed)
Pt states LLQ abd pain/pelvic pain that began Friday. Denies N&V&D, denies urinary symptoms. Denies vaginal bleeding. A&O. Ambulatory.

## 2020-02-05 ENCOUNTER — Ambulatory Visit: Payer: Commercial Managed Care - PPO | Attending: Internal Medicine

## 2020-02-05 DIAGNOSIS — Z23 Encounter for immunization: Secondary | ICD-10-CM

## 2020-02-05 NOTE — Progress Notes (Signed)
   Covid-19 Vaccination Clinic  Name:  Jaclyn Jackson    MRN: [id number] DOB: 1981-04-23  02/05/2020  [name] was observed post Covid-19 immunization for 15 minutes without incident. She was provided with Vaccine Information Sheet and instruction to access the V-Safe system.   [name] was instructed to call 911 with any severe reactions post vaccine: Marland Kitchen Difficulty breathing  . Swelling of face and throat  . A fast heartbeat  . A bad rash all over body  . Dizziness and weakness   Immunizations Administered    Name Date Dose VIS Date Route   Pfizer COVID-19 Vaccine 02/05/2020  3:27 PM 0.3 mL 10/17/2019 Intramuscular   Manufacturer: [employer], [employer]   Lot: [id number]   NDC: [address]-4

## 2020-02-27 ENCOUNTER — Ambulatory Visit: Payer: Commercial Managed Care - PPO | Attending: Oncology

## 2020-02-27 DIAGNOSIS — Z23 Encounter for immunization: Secondary | ICD-10-CM

## 2020-02-27 NOTE — Progress Notes (Signed)
   Covid-19 Vaccination Clinic  Name:  Jaclyn Jackson    MRN: [id number] DOB: [birth year]  02/27/2020  [name] was observed post Covid-19 immunization for 15 minutes without incident. She was provided with Vaccine Information Sheet and instruction to access the V-Safe system.   [name] was instructed to call 911 with any severe reactions post vaccine: Marland Kitchen Difficulty breathing  . Swelling of face and throat  . A fast heartbeat  . A bad rash all over body  . Dizziness and weakness   Immunizations Administered    Name Date Dose VIS Date Route   Pfizer COVID-19 Vaccine 02/27/2020  3:16 PM 0.3 mL 12/31/2018 Intramuscular   Manufacturer: [employer], [employer]   Lot: [id number]   NDC: [address]-2

## 2020-06-25 ENCOUNTER — Emergency Department
Admission: EM | Admit: 2020-06-25 | Discharge: 2020-06-25 | Disposition: A | Discharge status: Home / Self Care | Payer: Commercial Managed Care - PPO | Attending: Emergency Medicine | Admitting: Emergency Medicine

## 2020-06-25 ENCOUNTER — Encounter: Payer: Self-pay | Admitting: Emergency Medicine

## 2020-06-25 ENCOUNTER — Other Ambulatory Visit: Payer: Self-pay

## 2020-06-25 DIAGNOSIS — G43909 Migraine, unspecified, not intractable, without status migrainosus: Secondary | ICD-10-CM | POA: Diagnosis present

## 2020-06-25 MED ORDER — METOCLOPRAMIDE HCL 5 MG/ML IJ SOLN
10.0000 mg | Freq: Once | INTRAMUSCULAR | Status: AC
  Administered 2020-06-25: 10 mg via INTRAVENOUS
  Filled 2020-06-25: qty 2

## 2020-06-25 MED ORDER — SODIUM CHLORIDE 0.9 % IV BOLUS
1000.0000 mL | Freq: Once | INTRAVENOUS | Status: AC
  Administered 2020-06-25: 1000 mL via INTRAVENOUS

## 2020-06-25 MED ORDER — DIPHENHYDRAMINE HCL 50 MG/ML IJ SOLN
25.0000 mg | Freq: Once | INTRAMUSCULAR | Status: AC
  Administered 2020-06-25: 25 mg via INTRAVENOUS
  Filled 2020-06-25: qty 1

## 2020-06-25 MED ORDER — HYDROMORPHONE HCL 1 MG/ML IJ SOLN
0.5000 mg | Freq: Once | INTRAMUSCULAR | Status: AC
  Administered 2020-06-25: 0.5 mg via INTRAVENOUS
  Filled 2020-06-25: qty 1

## 2020-06-25 MED ORDER — ONDANSETRON HCL 4 MG/2ML IJ SOLN
4.0000 mg | Freq: Once | INTRAMUSCULAR | Status: AC
  Administered 2020-06-25: 4 mg via INTRAVENOUS
  Filled 2020-06-25: qty 2

## 2020-06-25 MED ORDER — KETOROLAC TROMETHAMINE 30 MG/ML IJ SOLN
30.0000 mg | Freq: Once | INTRAMUSCULAR | Status: AC
  Administered 2020-06-25: 30 mg via INTRAVENOUS
  Filled 2020-06-25: qty 1

## 2020-06-25 NOTE — ED Provider Notes (Signed)
[facility] Emergency Department Provider Note  ____________________________________________   First MD Initiated Contact with Patient 06/25/20 1326     (approximate)  I have reviewed the triage vital signs and the nursing notes.   HISTORY  Chief Complaint Migraine   HPI Jaclyn Jackson is a 39 y.o. female presents to the ED with complaint of migraine headache for the last 4 to 4-1/2 days.  Patient states that her PCP prescribed Imitrex and ibuprofen which is not helped.  Patient last took medication yesterday.  She continues to have photophobia and a headache that starts from the front of her head to the back of her neck.  She states this is no different than her normal migraine headache.  She continues to have some nausea but denies any visual changes.  Patient has a history of migraines and her last CT scan was reviewed from April-2019.     History reviewed. No pertinent past medical history.  There are no problems to display for this patient.   Past Surgical History:  Procedure Laterality Date  . ABDOMINAL SURGERY    . CESAREAN SECTION    . TUBAL LIGATION      Prior to Admission medications   Medication Sig Start Date End Date Taking? Authorizing Provider  dicyclomine (BENTYL) 10 MG capsule Take 1 capsule (10 mg total) by mouth 4 (four) times daily as needed for up to 14 days for spasms. 01/25/20 02/08/20  Triplett, Rulon Eisenmenger B, FNP  doxycycline (VIBRAMYCIN) 100 MG capsule Take 1 capsule (100 mg total) by mouth 2 (two) times daily. 06/07/15   Emily Filbert, MD  doxycycline (VIBRAMYCIN) 100 MG capsule Take 1 capsule (100 mg total) by mouth 2 (two) times daily. 11/22/18   Emily Filbert, MD  HYDROcodone-acetaminophen (NORCO) 5-325 MG per tablet Take 1 tablet by mouth every 4 (four) hours as needed for moderate pain. 05/12/15   Darien Ramus, MD  metroNIDAZOLE (FLAGYL) 500 MG tablet Take 1 tablet (500 mg total) by mouth 2 (two) times daily. 06/07/15    Emily Filbert, MD  metroNIDAZOLE (FLAGYL) 500 MG tablet Take 1 tablet (500 mg total) by mouth 3 (three) times daily. 11/22/18   Emily Filbert, MD  oxyCODONE-acetaminophen (ROXICET) 5-325 MG per tablet Take 1 tablet by mouth every 6 (six) hours as needed. 06/07/15   Emily Filbert, MD    Allergies Acetaminophen  History reviewed. No pertinent family history.  Social History Social History   Tobacco Use  . Smoking status: Never Smoker  . Smokeless tobacco: Never Used  Substance Use Topics  . Alcohol use: No  . Drug use: Not on file    Review of Systems Constitutional: No fever/chills Eyes: No visual changes.  Positive photophobia. ENT: No sore throat.  No rhinorrhea. Cardiovascular: Denies chest pain. Respiratory: Denies shortness of breath.  Negative for cough. Gastrointestinal: No abdominal pain.  Positive nausea, no vomiting.  No diarrhea.  No constipation. Genitourinary: Negative for dysuria. Musculoskeletal: Negative for back pain. Skin: Negative for rash. Neurological: Positive for headaches, negative for focal weakness or numbness.  ____________________________________________   PHYSICAL EXAM:  VITAL SIGNS: ED Triage Vitals  Enc Vitals Group     BP 06/25/20 1134 106/79     Pulse Rate 06/25/20 1134 78     Resp 06/25/20 1134 17     Temp 06/25/20 1134 98.2 F (36.8 C)     Temp Source 06/25/20 1134 Oral     SpO2 06/25/20 1134  100 %     Weight 06/25/20 1210 145 lb (65.8 kg)     Height 06/25/20 1210 5\' 2"  (1.575 m)     Head Circumference --      Peak Flow --      Pain Score 06/25/20 1210 10     Pain Loc --      Pain Edu? --      Excl. in [place]? --     Constitutional: Alert and oriented. Well appearing and in no acute distress. Eyes: Conjunctivae are normal. PERRL. EOMI and without nystagmus.  Patient with positive photophobia. Head: Atraumatic. Nose: No congestion/rhinnorhea. Mouth/Throat: Mucous membranes are moist.  Oropharynx  non-erythematous. Neck: No stridor.  No point tenderness on palpation of cervical spine posteriorly.  However patient does have some tenderness of the paravertebral muscles into the trapezius muscles bilaterally.  Patient is able to flex and extend her neck fully without restriction. Hematological/Lymphatic/Immunilogical: No cervical lymphadenopathy. Cardiovascular: Normal rate, regular rhythm. Grossly normal heart sounds.  Good peripheral circulation. Respiratory: Normal respiratory effort.  No retractions. Lungs CTAB. Gastrointestinal: Soft and nontender. No distention.  Musculoskeletal: Moves upper and lower extremities they have difficulty.  Normal gait was noted.  Good muscle strength bilaterally. Neurologic:  Normal speech and language. No gross focal neurologic deficits are appreciated. No gait instability. Skin:  Skin is warm, dry and intact. No rash noted. Psychiatric: Mood and affect are normal. Speech and behavior are normal.  ____________________________________________   LABS (all labs ordered are listed, but only abnormal results are displayed)  Labs Reviewed - No data to display   PROCEDURES  Procedure(s) performed (including Critical Care):  Procedures   ____________________________________________   INITIAL IMPRESSION / ASSESSMENT AND PLAN / ED COURSE  As part of my medical decision making, I reviewed the following data within the electronic MEDICAL RECORD NUMBER Notes from prior ED visits and [place] Controlled Substance Database  Jaclyn Jackson was evaluated in Emergency Department on 06/25/2020 for the symptoms described in the history of present illness. She was evaluated in the context of the global COVID-19 pandemic, which necessitated consideration that the patient might be at risk for infection with the SARS-CoV-2 virus that causes COVID-19. Institutional protocols and algorithms that pertain to the evaluation of patients at risk for COVID-19 are in a state of rapid  change based on information released by regulatory bodies including the CDC and federal and state organizations. These policies and algorithms were followed during the patient's care in the ED.  ----------------------------------------- 2:33 PM on 06/25/2020 ----------------------------------------- Patient states at this time she is has some nausea but continues to have a headache.  Zofran IV and Dilaudid 0.5 mg was ordered.  Patient does not appear to be in any acute distress.  ----------------------------------------- 3:37 PM on 06/25/2020 ----------------------------------------- Has improved greatly.  Arrangements are being made to discharge her and she is to follow-up with her PCP if any continued problems.  39 year old female presents to the ED with complaint of migraine headache.  She has taken her medication that her doctor prescribed without any relief.  Was given a liter of saline along with Toradol 30 mg, Benadryl 25 mg and Reglan 10 mg IV.  Patient states that she continues to have a slight headache with some nausea.  This cleared after being given Zofran and Dilaudid.  Patient is encouraged to follow-up with her PCP if any continued problems.    ____________________________________________   FINAL CLINICAL IMPRESSION(S) / ED DIAGNOSES  Final diagnoses:  Migraine without  status migrainosus, not intractable, unspecified migraine type     ED Discharge Orders    None       Note:  This document was prepared using Dragon voice recognition software and may include unintentional dictation errors.    Tommi Rumps, PA-C 07/02/20 [id number]    Jene Every, MD 07/02/20 1300

## 2020-06-25 NOTE — Discharge Instructions (Addendum)
Follow-up with your primary care provider if any continued problems or concerns.  Return to the emergency department if any severe worsening of your migraine over the weekend.  Continue to drink lots of fluids to stay hydrated.  Continue your regular medication as prescribed by your doctor.

## 2020-06-25 NOTE — ED Notes (Signed)
Pt reports migraine and hx of the same but has not been able to get relief. Provider in to see pt, see provider assessment as well. Pt denies difference in this pain compared to previous migraines.

## 2020-06-25 NOTE — ED Triage Notes (Signed)
Pt to ED via POV c/o Migraine since Monday. Pt saw her PCP and was given 3 shots and PO medication to take at home but pt states that it is not working. Pt has hx/o migraines. Pt states that she is hurting from her eyes to the back of her head. Pt denies any other symptoms at this time.

## 2020-06-28 ENCOUNTER — Encounter: Payer: Self-pay | Admitting: Emergency Medicine

## 2020-06-28 ENCOUNTER — Emergency Department
Admission: EM | Admit: 2020-06-28 | Discharge: 2020-06-28 | Disposition: A | Discharge status: Home / Self Care | Payer: Commercial Managed Care - PPO | Attending: Emergency Medicine | Admitting: Emergency Medicine

## 2020-06-28 ENCOUNTER — Other Ambulatory Visit: Payer: Self-pay

## 2020-06-28 ENCOUNTER — Emergency Department: Payer: Commercial Managed Care - PPO

## 2020-06-28 DIAGNOSIS — M542 Cervicalgia: Secondary | ICD-10-CM | POA: Diagnosis not present

## 2020-06-28 DIAGNOSIS — R519 Headache, unspecified: Secondary | ICD-10-CM | POA: Diagnosis present

## 2020-06-28 DIAGNOSIS — R5383 Other fatigue: Secondary | ICD-10-CM | POA: Insufficient documentation

## 2020-06-28 LAB — BASIC METABOLIC PANEL
Anion gap: 10 (ref 5–15)
BUN: 18 mg/dL (ref 6–20)
CO2: 25 mmol/L (ref 22–32)
Calcium: 9.1 mg/dL (ref 8.9–10.3)
Chloride: 107 mmol/L (ref 98–111)
Creatinine, Ser: 0.72 mg/dL (ref 0.44–1.00)
GFR calc Af Amer: >60 mL/min (ref >60)
GFR calc non Af Amer: >60 mL/min (ref >60)
Glucose, Bld: 93 mg/dL (ref 70–99)
Potassium: 4.1 mmol/L (ref 3.5–5.1)
Sodium: 142 mmol/L (ref 135–145)

## 2020-06-28 LAB — CBC WITH DIFFERENTIAL/PLATELET
Abs Immature Granulocytes: 0.02 10*3/uL (ref 0.00–0.07)
Basophils Absolute: 0 10*3/uL (ref 0.0–0.1)
Basophils Relative: 1 %
Eosinophils Absolute: 0.1 10*3/uL (ref 0.0–0.5)
Eosinophils Relative: 3 %
HCT: 32.3 % — ABNORMAL LOW (ref 36.0–46.0)
Hemoglobin: 9.9 g/dL — ABNORMAL LOW (ref 12.0–15.0)
Immature Granulocytes: 1 %
Lymphocytes Relative: 26 %
Lymphs Abs: 1.1 10*3/uL (ref 0.7–4.0)
MCH: 20.5 pg — ABNORMAL LOW (ref 26.0–34.0)
MCHC: 30.7 g/dL (ref 30.0–36.0)
MCV: 67 fL — ABNORMAL LOW (ref 80.0–100.0)
Monocytes Absolute: 0.5 10*3/uL (ref 0.1–1.0)
Monocytes Relative: 11 %
Neutro Abs: 2.5 10*3/uL (ref 1.7–7.7)
Neutrophils Relative %: 58 %
Platelets: 232 10*3/uL (ref 150–400)
RBC: 4.82 MIL/uL (ref 3.87–5.11)
RDW: 18.1 % — ABNORMAL HIGH (ref 11.5–15.5)
WBC: 4.2 10*3/uL (ref 4.0–10.5)
nRBC: 0 % (ref 0.0–0.2)

## 2020-06-28 MED ORDER — PROCHLORPERAZINE EDISYLATE 10 MG/2ML IJ SOLN
10.0000 mg | Freq: Once | INTRAMUSCULAR | Status: AC
  Administered 2020-06-28: 10 mg via INTRAVENOUS
  Filled 2020-06-28: qty 2

## 2020-06-28 MED ORDER — CYCLOBENZAPRINE HCL 10 MG PO TABS: 10.0000 mg | ORAL_TABLET | Freq: Three times a day (TID) | ORAL | 0 refills | Status: DC | PRN

## 2020-06-28 MED ORDER — DIPHENHYDRAMINE HCL 50 MG/ML IJ SOLN
25.0000 mg | Freq: Once | INTRAMUSCULAR | Status: AC
  Administered 2020-06-28: 25 mg via INTRAVENOUS
  Filled 2020-06-28: qty 1

## 2020-06-28 MED ORDER — MORPHINE SULFATE (PF) 4 MG/ML IV SOLN
4.0000 mg | Freq: Once | INTRAVENOUS | Status: AC
  Administered 2020-06-28: 4 mg via INTRAVENOUS
  Filled 2020-06-28: qty 1

## 2020-06-28 MED ORDER — HYDROCODONE-ACETAMINOPHEN 5-325 MG PO TABS: 1.0000 | ORAL_TABLET | Freq: Four times a day (QID) | ORAL | 0 refills | Status: AC | PRN

## 2020-06-28 MED ORDER — KETOROLAC TROMETHAMINE 30 MG/ML IJ SOLN
15.0000 mg | Freq: Once | INTRAMUSCULAR | Status: AC
  Administered 2020-06-28: 15 mg via INTRAVENOUS
  Filled 2020-06-28: qty 1

## 2020-06-28 MED ORDER — HALOPERIDOL LACTATE 5 MG/ML IJ SOLN
2.0000 mg | Freq: Once | INTRAMUSCULAR | Status: AC
  Administered 2020-06-28: 2 mg via INTRAVENOUS
  Filled 2020-06-28: qty 1

## 2020-06-28 MED ORDER — LACTATED RINGERS IV BOLUS
1000.0000 mL | Freq: Once | INTRAVENOUS | Status: AC
  Administered 2020-06-28: 1000 mL via INTRAVENOUS

## 2020-06-28 MED ORDER — DEXAMETHASONE SODIUM PHOSPHATE 10 MG/ML IJ SOLN
10.0000 mg | Freq: Once | INTRAMUSCULAR | Status: AC
  Administered 2020-06-28: 10 mg via INTRAVENOUS
  Filled 2020-06-28: qty 1

## 2020-06-28 NOTE — ED Triage Notes (Signed)
Patient to ER for c/o continued headache. Patient states she was seen on 8/20 and given several medications for migraine. States now she feels "like needles in the back of my head" that began last night. States medications helped for approximately two hours, but headache came back again. Patient reports +sensitivity to light and noise.

## 2020-06-28 NOTE — ED Provider Notes (Signed)
[facility] Emergency Department Provider Note  ____________________________________________   First MD Initiated Contact with Patient 06/28/20 1316     (approximate)  I have reviewed the triage vital signs and the nursing notes.   HISTORY  Chief Complaint Headache    HPI Jaclyn Jackson is a 39 y.o. female  Here with headache, neck pain. Pt reports that she was just seen here on 8/20 with headache. Began gradually last week as pain in her occipital area shooting up around her scalp to her eyes. Pain has been aching, throbbing, and severe. She was given migraine meds w/ mild improvement here, returned home and has had headache since then. She has now also developed some pain in her left paraspinal neck area radiating down her L arm, with a tingling sensation. HA is worse with loud noises, light which is usual for her migraines. No fevers. No thunderclap onset. No focal numbness or weakness.        History reviewed. No pertinent past medical history.  There are no problems to display for this patient.   Past Surgical History:  Procedure Laterality Date  . ABDOMINAL SURGERY    . CESAREAN SECTION    . TUBAL LIGATION      Prior to Admission medications   Medication Sig Start Date End Date Taking? Authorizing Provider  cyclobenzaprine (FLEXERIL) 10 MG tablet Take 1 tablet (10 mg total) by mouth 3 (three) times daily as needed for muscle spasms. 06/28/20   Shaune Pollack, MD  dicyclomine (BENTYL) 10 MG capsule Take 1 capsule (10 mg total) by mouth 4 (four) times daily as needed for up to 14 days for spasms. 01/25/20 02/08/20  Triplett, Rulon Eisenmenger B, FNP  doxycycline (VIBRAMYCIN) 100 MG capsule Take 1 capsule (100 mg total) by mouth 2 (two) times daily. 06/07/15   Emily Filbert, MD  doxycycline (VIBRAMYCIN) 100 MG capsule Take 1 capsule (100 mg total) by mouth 2 (two) times daily. 11/22/18   Emily Filbert, MD  HYDROcodone-acetaminophen (NORCO/VICODIN)  5-325 MG tablet Take 1-2 tablets by mouth every 6 (six) hours as needed for moderate pain or severe pain. 06/28/20 06/28/21  Shaune Pollack, MD  metroNIDAZOLE (FLAGYL) 500 MG tablet Take 1 tablet (500 mg total) by mouth 2 (two) times daily. 06/07/15   Emily Filbert, MD  metroNIDAZOLE (FLAGYL) 500 MG tablet Take 1 tablet (500 mg total) by mouth 3 (three) times daily. 11/22/18   Emily Filbert, MD  oxyCODONE-acetaminophen (ROXICET) 5-325 MG per tablet Take 1 tablet by mouth every 6 (six) hours as needed. 06/07/15   Emily Filbert, MD    Allergies Acetaminophen  No family history on file.  Social History Social History   Tobacco Use  . Smoking status: Never Smoker  . Smokeless tobacco: Never Used  Substance Use Topics  . Alcohol use: No  . Drug use: Not on file    Review of Systems  Review of Systems  Constitutional: Positive for fatigue. Negative for chills and fever.  HENT: Negative for sore throat.   Respiratory: Negative for shortness of breath.   Cardiovascular: Negative for chest pain.  Gastrointestinal: Negative for abdominal pain.  Genitourinary: Negative for flank pain.  Musculoskeletal: Positive for neck pain.  Skin: Negative for rash and wound.  Allergic/Immunologic: Negative for immunocompromised state.  Neurological: Positive for headaches. Negative for weakness and numbness.  Hematological: Does not bruise/bleed easily.  All other systems reviewed and are negative.    ____________________________________________  PHYSICAL EXAM:  VITAL SIGNS: ED Triage Vitals  Enc Vitals Group     BP 06/28/20 1138 125/70     Pulse Rate 06/28/20 1138 76     Resp 06/28/20 1138 18     Temp 06/28/20 1138 98.4 F (36.9 C)     Temp Source 06/28/20 1138 Oral     SpO2 06/28/20 1138 100 %     Weight 06/28/20 1139 145 lb 1 oz (65.8 kg)     Height 06/28/20 1139 5\' 2"  (1.575 m)     Head Circumference --      Peak Flow --      Pain Score 06/28/20 1139 10     Pain  Loc --      Pain Edu? --      Excl. in [place]? --      Physical Exam Vitals and nursing note reviewed.  Constitutional:      General: She is not in acute distress.    Appearance: She is well-developed.  HENT:     Head: Normocephalic and atraumatic.  Eyes:     Conjunctiva/sclera: Conjunctivae normal.  Neck:     Comments: TTP over L paracervical musculature, worse w/ palpation and rotation of head. +Spurling's on left. Cardiovascular:     Rate and Rhythm: Normal rate and regular rhythm.     Heart sounds: Normal heart sounds.     Comments: 2+ radial and Dp/PT pulses b/l Pulmonary:     Effort: Pulmonary effort is normal. No respiratory distress.     Breath sounds: No wheezing.  Abdominal:     General: There is no distension.  Musculoskeletal:     Cervical back: Neck supple.  Skin:    General: Skin is warm.     Capillary Refill: Capillary refill takes less than 2 seconds.     Findings: No rash.  Neurological:     Mental Status: She is alert and oriented to person, place, and time.     Motor: No abnormal muscle tone.     Comments: Neurological Exam:  Mental Status: Alert and oriented to person, place, and time. Attention and concentration normal. Speech clear. Recent memory is intact. Cranial Nerves: Visual fields grossly intact. EOMI and PERRLA. No nystagmus noted. Facial sensation intact at forehead, maxillary cheek, and chin/mandible bilaterally. No facial asymmetry or weakness. Hearing grossly normal. Uvula is midline, and palate elevates symmetrically. Normal SCM and trapezius strength. Tongue midline without fasciculations. Motor: Muscle strength 5/5 in proximal and distal UE and LE bilaterally. No pronator drift. Muscle tone normal.  Sensation: Intact to light touch in upper and lower extremities distally bilaterally.  Gait: Normal without ataxia. Coordination: Normal FTN bilaterally.          ____________________________________________   LABS (all labs ordered are  listed, but only abnormal results are displayed)  Labs Reviewed  CBC WITH DIFFERENTIAL/PLATELET - Abnormal; Notable for the following components:      Result Value   Hemoglobin 9.9 (*)    HCT 32.3 (*)    MCV 67.0 (*)    MCH 20.5 (*)    RDW 18.1 (*)    All other components within normal limits  BASIC METABOLIC PANEL    ____________________________________________  EKG:  ________________________________________  RADIOLOGY All imaging, including plain films, CT scans, and ultrasounds, independently reviewed by me, and interpretations confirmed via formal radiology reads.  ED MD interpretation:   CT Head: NAICA CT Spine: No significant abnormality   Official radiology report(s): CT Head Wo Contrast  Result Date:  06/28/2020 CLINICAL DATA:  Headache EXAM: CT HEAD WITHOUT CONTRAST TECHNIQUE: Contiguous axial images were obtained from the base of the skull through the vertex without intravenous contrast. COMPARISON:  CT head 02/11/2018 FINDINGS: Brain: No evidence of acute infarction, hemorrhage, hydrocephalus, extra-axial collection or mass lesion/mass effect. Vascular: Negative for hyperdense vessel Skull: Negative Sinuses/Orbits: Mucosal edema paranasal sinuses. Air-fluid level right posterior ethmoid sinus. Negative orbit. Other: None IMPRESSION: Negative CT of the brain Sinus mucosal disease with air-fluid level right posterior ethmoid sinus. Electronically Signed   By: Marlan Palau M.D.   On: 06/28/2020 15:00   CT Cervical Spine Wo Contrast  Result Date: 06/28/2020 CLINICAL DATA:  Headache, neck pain EXAM: CT CERVICAL SPINE WITHOUT CONTRAST TECHNIQUE: Multidetector CT imaging of the cervical spine was performed without intravenous contrast. Multiplanar CT image reconstructions were also generated. COMPARISON:  None. FINDINGS: Alignment: Anteroposterior alignment is maintained. Skull base and vertebrae: Vertebral body heights are preserved. There is no sclerotic or destructive  osseous lesion. Soft tissues and spinal canal: No prevertebral fluid or swelling. No visible canal hematoma. Disc levels: Minor disc space narrowing at C5-C6. Possible tiny central protrusion at C4-C5. There is no canal or foraminal stenosis at any level. Upper chest: Negative. Other: None. IMPRESSION: No significant abnormality. Electronically Signed   By: Guadlupe Spanish M.D.   On: 06/28/2020 15:04    ____________________________________________  PROCEDURES   Procedure(s) performed (including Critical Care):  Procedures  ____________________________________________  INITIAL IMPRESSION / MDM / ASSESSMENT AND PLAN / ED COURSE  As part of my medical decision making, I reviewed the following data within the electronic MEDICAL RECORD NUMBER Nursing notes reviewed and incorporated, Old chart reviewed, Notes from prior ED visits, and [place] Controlled Substance Database       *MOSELLE RISTER was evaluated in Emergency Department on 06/28/2020 for the symptoms described in the history of present illness. She was evaluated in the context of the global COVID-19 pandemic, which necessitated consideration that the patient might be at risk for infection with the SARS-CoV-2 virus that causes COVID-19. Institutional protocols and algorithms that pertain to the evaluation of patients at risk for COVID-19 are in a state of rapid change based on information released by regulatory bodies including the CDC and federal and state organizations. These policies and algorithms were followed during the patient's care in the ED.  Some ED evaluations and interventions may be delayed as a result of limited staffing during the pandemic.*     Medical Decision Making:  39 yo F here with headache, neck and arm pain. Pt was just seen here with similar complaints. CT head/C-Spine subsequently obtained and is negative. Labs reassuring without leukocytosis, and pt has no fever or signs of meningitis or encephalitis. IVF, antimigraine  meds given w/ resolution of HA. Clinically, I suspect pt may have migraines and component of cervicogenic pain given +Spurling's and radicular component. HA began gradually and is not consistent with SAH, venous thrombosis. Will dc with analgesia and muscle relaxers for likely MSk component, and outpt follow-up.  ____________________________________________  FINAL CLINICAL IMPRESSION(S) / ED DIAGNOSES  Final diagnoses:  Bad headache  Cervical pain (neck)     MEDICATIONS GIVEN DURING THIS VISIT:  Medications  dexamethasone (DECADRON) injection 10 mg (10 mg Intravenous Given 06/28/20 1340)  prochlorperazine (COMPAZINE) injection 10 mg (10 mg Intravenous Given 06/28/20 1341)  diphenhydrAMINE (BENADRYL) injection 25 mg (25 mg Intravenous Given 06/28/20 1340)  lactated ringers bolus 1,000 mL (0 mLs Intravenous Stopped 06/28/20 1506)  ketorolac (  TORADOL) 30 MG/ML injection 15 mg (15 mg Intravenous Given 06/28/20 1517)  morphine 4 MG/ML injection 4 mg (4 mg Intravenous Given 06/28/20 1519)  haloperidol lactate (HALDOL) injection 2 mg (2 mg Intravenous Given 06/28/20 1626)     ED Discharge Orders         Ordered    HYDROcodone-acetaminophen (NORCO/VICODIN) 5-325 MG tablet  Every 6 hours PRN        06/28/20 1716    cyclobenzaprine (FLEXERIL) 10 MG tablet  3 times daily PRN        06/28/20 1716           Note:  This document was prepared using Dragon voice recognition software and may include unintentional dictation errors.   Shaune Pollack, MD 06/28/20 [phone]

## 2020-06-28 NOTE — ED Notes (Signed)
Pt presentation discussed with EDP, Paduchowski; no new orders at this time.

## 2020-06-28 NOTE — ED Notes (Signed)
Assisted to bathroom

## 2020-06-28 NOTE — ED Notes (Signed)
Patient transported to CT 

## 2020-07-26 DIAGNOSIS — R110 Nausea: Secondary | ICD-10-CM | POA: Insufficient documentation

## 2020-07-26 DIAGNOSIS — H9203 Otalgia, bilateral: Secondary | ICD-10-CM | POA: Insufficient documentation

## 2020-07-26 DIAGNOSIS — H53149 Visual discomfort, unspecified: Secondary | ICD-10-CM | POA: Insufficient documentation

## 2020-07-26 DIAGNOSIS — G4440 Drug-induced headache, not elsewhere classified, not intractable: Secondary | ICD-10-CM | POA: Insufficient documentation

## 2020-07-26 DIAGNOSIS — R202 Paresthesia of skin: Secondary | ICD-10-CM | POA: Insufficient documentation

## 2020-08-04 ENCOUNTER — Other Ambulatory Visit: Payer: Self-pay | Admitting: Neurology

## 2020-08-04 DIAGNOSIS — R519 Headache, unspecified: Secondary | ICD-10-CM

## 2020-08-11 DIAGNOSIS — R519 Headache, unspecified: Secondary | ICD-10-CM | POA: Insufficient documentation

## 2020-08-20 ENCOUNTER — Other Ambulatory Visit: Payer: Self-pay

## 2020-08-20 ENCOUNTER — Ambulatory Visit
Admission: RE | Admit: 2020-08-20 | Discharge: 2020-08-20 | Disposition: A | Discharge status: Home / Self Care | Payer: Commercial Managed Care - PPO | Source: Ambulatory Visit | Attending: Neurology | Admitting: Neurology

## 2020-08-20 DIAGNOSIS — R519 Headache, unspecified: Secondary | ICD-10-CM | POA: Insufficient documentation

## 2020-12-21 DIAGNOSIS — M542 Cervicalgia: Secondary | ICD-10-CM | POA: Insufficient documentation

## 2021-08-01 DIAGNOSIS — Z23 Encounter for immunization: Secondary | ICD-10-CM | POA: Insufficient documentation

## 2022-06-06 ENCOUNTER — Other Ambulatory Visit: Payer: Self-pay | Admitting: Physician Assistant

## 2022-06-06 DIAGNOSIS — R202 Paresthesia of skin: Secondary | ICD-10-CM

## 2022-06-06 DIAGNOSIS — M542 Cervicalgia: Secondary | ICD-10-CM

## 2022-06-13 ENCOUNTER — Ambulatory Visit
Admission: RE | Admit: 2022-06-13 | Discharge: 2022-06-13 | Disposition: A | Discharge status: Home / Self Care | Payer: Commercial Managed Care - PPO | Source: Ambulatory Visit | Attending: Physician Assistant | Admitting: Physician Assistant

## 2022-06-13 DIAGNOSIS — R202 Paresthesia of skin: Secondary | ICD-10-CM

## 2022-06-13 DIAGNOSIS — M542 Cervicalgia: Secondary | ICD-10-CM

## 2022-07-02 IMAGING — CT CT HEAD W/O CM
3 series · 16 of 45 positions shown, 19 images · non-contrast
Comparison: CT head 02/11/2018

CLINICAL DATA: Headache

EXAM:
CT HEAD WITHOUT CONTRAST
TECHNIQUE: Contiguous axial images were obtained from the base of the skull
through the vertex without intravenous contrast.

[Series 3: head wo · axial · 0.39mm/px · z∈[+263,+378]mm · 10 of 28 slices shown, 13 images]
[im 3/28  brain]
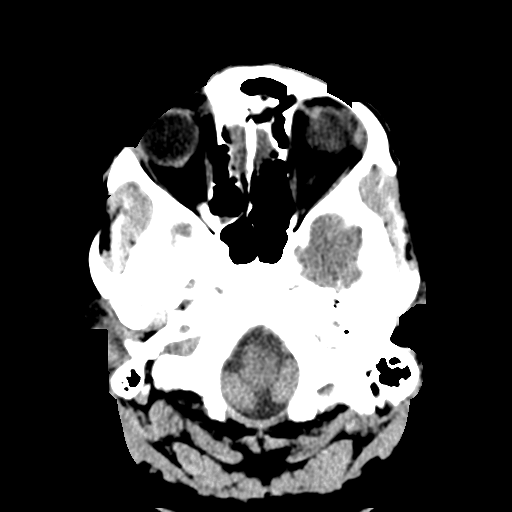
[im 3/28  bone]
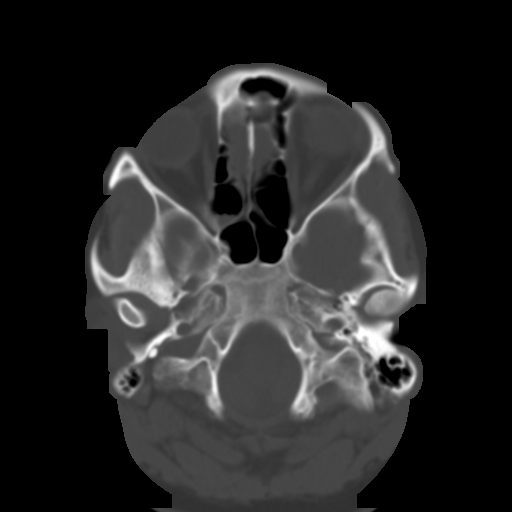
[im 5/28  brain]
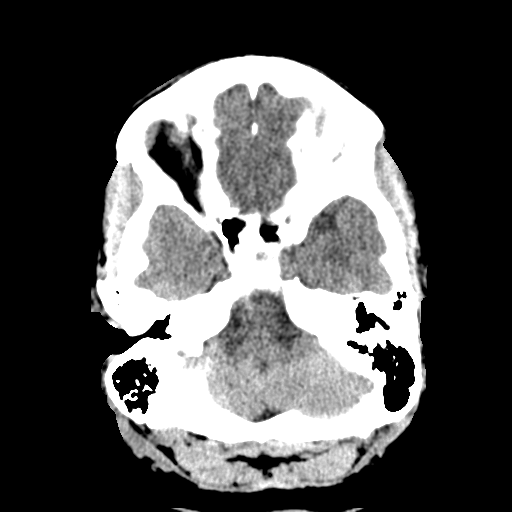
[im 8/28  brain]
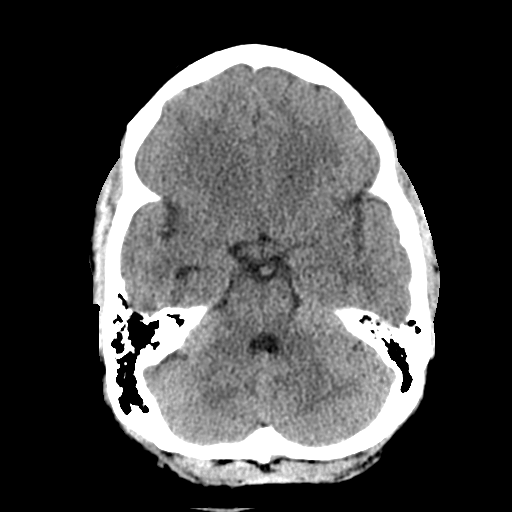
[im 11/28  brain]
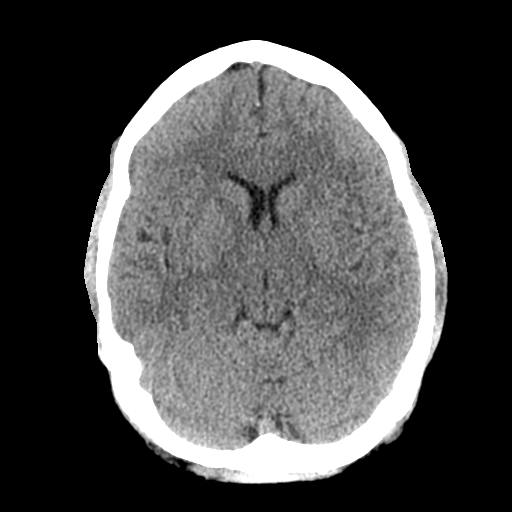
[im 13/28  brain]
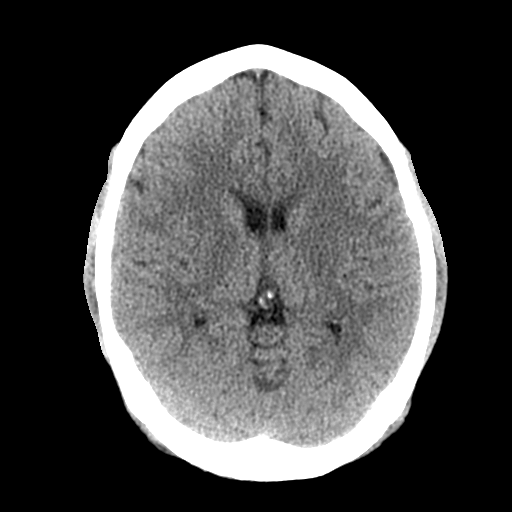
[im 13/28  bone]
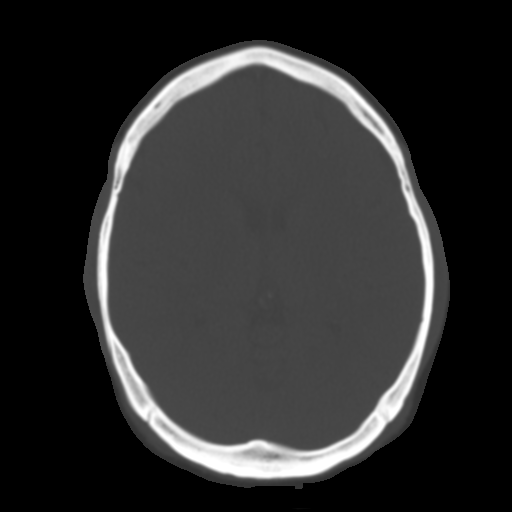
[im 16/28  brain]
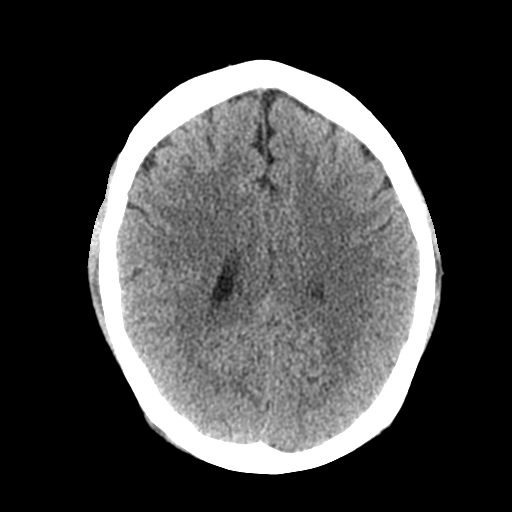
[im 18/28  brain]
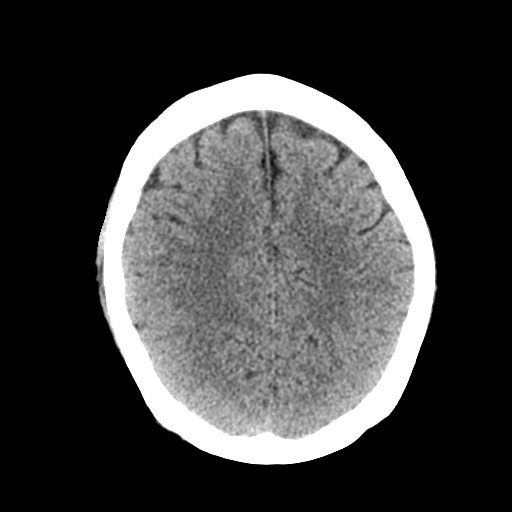
[im 21/28  brain]
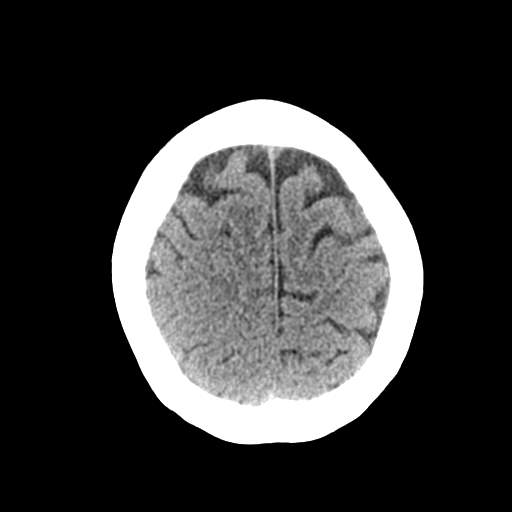
[im 24/28  brain]
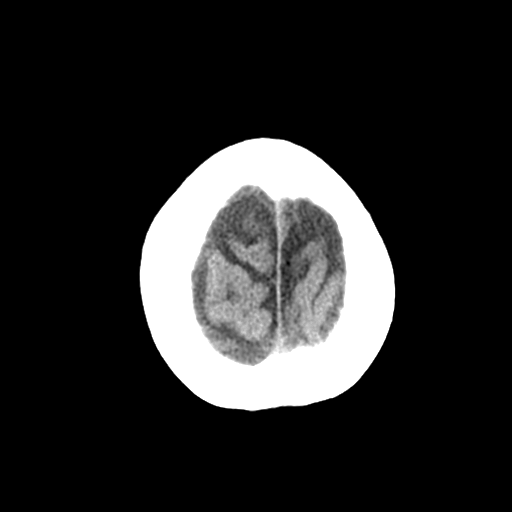
[im 24/28  bone]
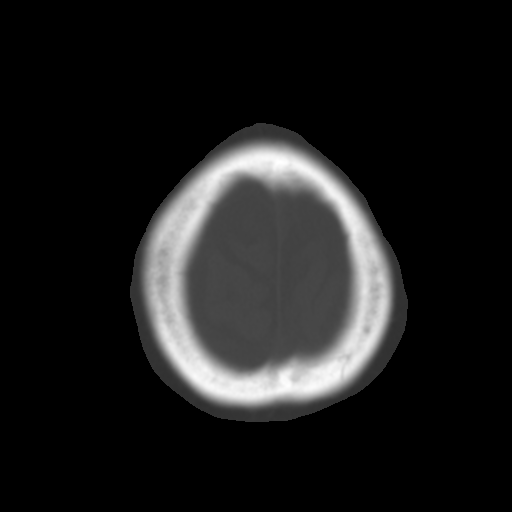
[im 26/28  brain]
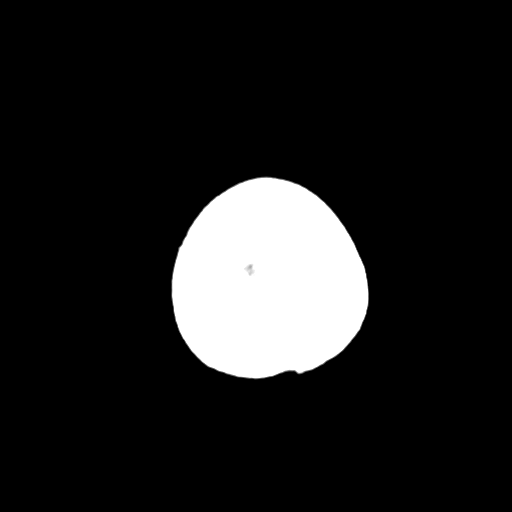

[Series 4: coronal soft tissue · coronal · 0.31mm/px · 3 of 61 slices shown]
[im 21/61  brain]
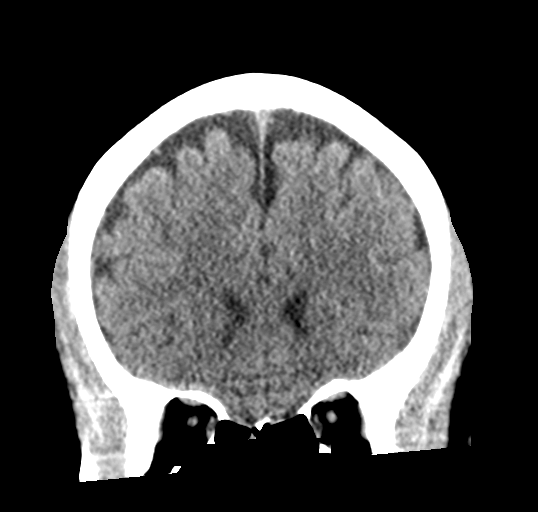
[im 27/61  brain]
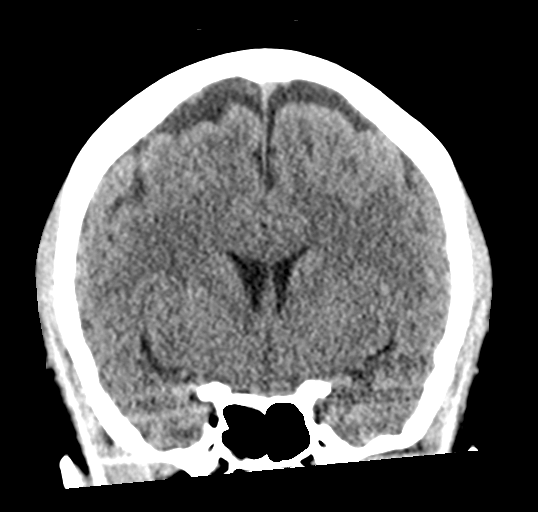
[im 34/61  brain]
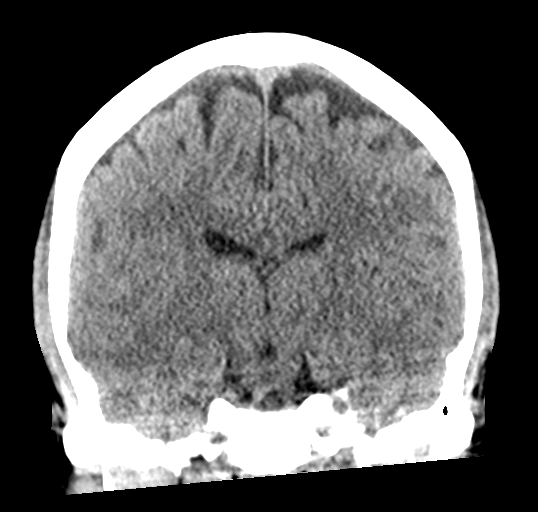

[Series 5: sagittal soft tissue · sagittal · 0.31mm/px · 3 of 53 slices shown]
[im 18/53  brain]
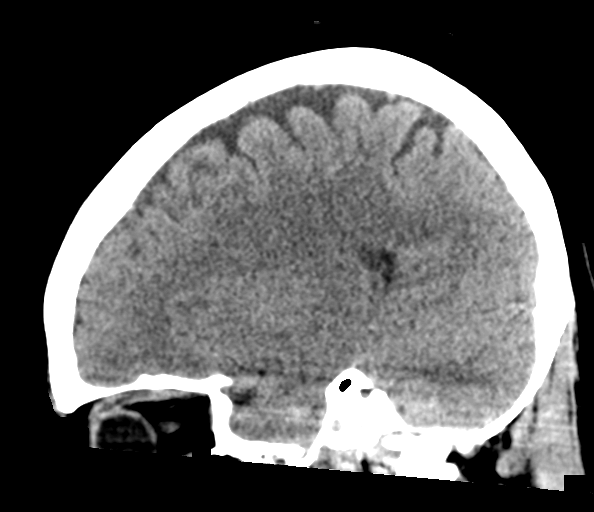
[im 27/53  brain]
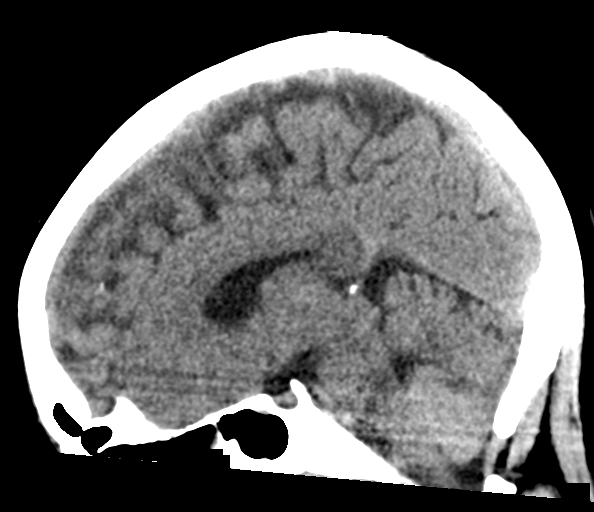
[im 35/53  brain]
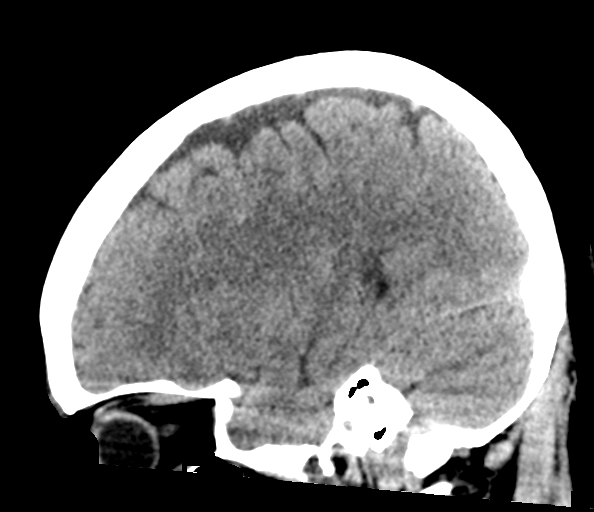

[16 of 45 positions shown; findings below may reference images not displayed]

FINDINGS: Brain: No evidence of acute infarction, hemorrhage, hydrocephalus,
extra-axial collection or mass lesion/mass effect.

Vascular: Negative for hyperdense vessel

Skull: Negative

Sinuses/Orbits: Mucosal edema paranasal sinuses. Air-fluid level
right posterior ethmoid sinus.

Negative orbit.

Other: None
IMPRESSION: Negative CT of the brain

Sinus mucosal disease with air-fluid level right posterior ethmoid
sinus.

## 2022-12-14 ENCOUNTER — Ambulatory Visit
Admission: EM | Admit: 2022-12-14 | Discharge: 2022-12-14 | Disposition: A | Discharge status: Home / Self Care | Payer: Commercial Managed Care - PPO | Attending: Emergency Medicine | Admitting: Emergency Medicine

## 2022-12-14 ENCOUNTER — Ambulatory Visit (INDEPENDENT_AMBULATORY_CARE_PROVIDER_SITE_OTHER): Payer: Commercial Managed Care - PPO

## 2022-12-14 ENCOUNTER — Encounter: Payer: Self-pay | Admitting: Emergency Medicine

## 2022-12-14 ENCOUNTER — Inpatient Hospital Stay
Admission: RE | Admit: 2022-12-14 | Discharge: 2022-12-14 | Disposition: A | Discharge status: Home / Self Care | Payer: Commercial Managed Care - PPO | Source: Ambulatory Visit | Attending: Emergency Medicine | Admitting: Emergency Medicine

## 2022-12-14 DIAGNOSIS — M25571 Pain in right ankle and joints of right foot: Secondary | ICD-10-CM

## 2022-12-14 MED ORDER — CYCLOBENZAPRINE HCL 10 MG PO TABS: 10.0000 mg | ORAL_TABLET | Freq: Every day | ORAL | 0 refills | Status: DC

## 2022-12-14 MED ORDER — KETOROLAC TROMETHAMINE 30 MG/ML IJ SOLN
30.0000 mg | Freq: Once | INTRAMUSCULAR | Status: AC
  Administered 2022-12-14: 30 mg via INTRAMUSCULAR

## 2022-12-14 MED ORDER — PREDNISONE 20 MG PO TABS: 40.0000 mg | ORAL_TABLET | Freq: Every day | ORAL | 0 refills | Status: DC

## 2022-12-14 NOTE — Discharge Instructions (Addendum)
Your x-ray today did not show injury to the bone of your ankle. Your pain is most likely being caused by irritation to the soft tissues or tendon, this should improve as time progresses. Unable to visualize tendons and ligaments on x-ray   You have been given an injection of Toradol today here in the office, ideally will see improvement in about 30 minutes, may take Tylenol for remainder of day  Starting tomorrow take prednisone every morning with food for 5 days to inflammation and swelling and help with pain, may take Tylenol while using this medication  May use muscle relaxer as needed at bedtime for comfort, be mindful of this will make you feel drowsy  You may apply heat or ice, whichever makes you feel better, to affected area in 15 minute intervals  You may continue activity as tolerated, there is no injury therefore, it is important that you continue to move around so you do not loose strength to the area  You may use ankle brace as needed for stability and support with activities, may remove at rest  If symptoms persist past 1 week, you may follow up at urgent care or with orthopedic specialist for evaluation, an orthopedic doctor specializes in the bone, they may provide  management such as but not limited to imaging, long term medications and physical therapy

## 2022-12-14 NOTE — ED Triage Notes (Signed)
Pt c/o right ankle pain. She states pain radiates into her lower leg. Started about 109 days ago.,No known injury.

## 2022-12-14 NOTE — ED Provider Notes (Signed)
[facility]    CSN: [id number] Arrival date & time:         History   Chief Complaint Chief Complaint  Patient presents with   Ankle Pain    Entered by patient    HPI Jaclyn Jackson is a 42 y.o. female.   Patient presents for evaluation of right-sided ankle pain present for 10 days without precipitating event, trauma or injury.  Endorses symptoms are mainly along the medial aspect radiating into the upper leg.  Pain is worse when bearing weight but able to complete movements.  Endorses tingling present to all 5 toes.  Endorses symptoms feel similar to when she had a tendon sprain approximately 1 year ago.  Has not attempted treatment  No past medical history on file.  There are no problems to display for this patient.    OB History   No obstetric history on file.      Home Medications    Prior to Admission medications   Not on File    Family History No family history on file.  Social History     Allergies   Patient has no allergy information on record.   Review of Systems Review of Systems   Physical Exam Triage Vital Signs ED Triage Vitals  Enc Vitals Group     BP      Pulse      Resp      Temp      Temp src      SpO2      Weight      Height      Head Circumference      Peak Flow      Pain Score      Pain Loc      Pain Edu?      Excl. in [place]?    No data found.  Updated Vital Signs LMP 11/25/2022   Visual Acuity Right Eye Distance:   Left Eye Distance:   Bilateral Distance:    Right Eye Near:   Left Eye Near:    Bilateral Near:     Physical Exam Constitutional:      Appearance: Normal appearance.  Eyes:     Extraocular Movements: Extraocular movements intact.  Pulmonary:     Effort: Pulmonary effort is normal.  Musculoskeletal:     Comments: Tenderness is present over the medial malleolus without swelling, ecchymosis or deformity, able to bear weight but pain is elicited, able to complete range of motion but  pain is elicited with all movements, sensations intact, 2+ pedal pulse  Skin:    General: Skin is warm and dry.  Neurological:     Mental Status: She is alert and oriented to person, place, and time. Mental status is at baseline.      UC Treatments / Results  Labs (all labs ordered are listed, but only abnormal results are displayed) Labs Reviewed - No data to display  EKG   Radiology DG Ankle Complete Right  Result Date: 12/14/2022 CLINICAL DATA:  42 year old female with medial ankle pain. EXAM: RIGHT ANKLE - COMPLETE 3+ VIEW COMPARISON:  None Available. FINDINGS: Bone mineralization is within normal limits. Mortise joint alignment preserved. Talar dome intact. There is no evidence of fracture, dislocation, or joint effusion. Calcaneus intact. No significant joint degeneration. Soft tissue contours appear normal. IMPRESSION: Negative. Electronically Signed   By: Genevie Ann M.D.   On: 12/14/2022 11:56    Procedures Procedures (including critical care time)  Medications  Ordered in UC Medications - No data to display  Initial Impression / Assessment and Plan / UC Course  I have reviewed the triage vital signs and the nursing notes.  Pertinent labs & imaging results that were available during my care of the patient were reviewed by me and considered in my medical decision making (see chart for details).  Acute right ankle pain  X-ray negative, discussed findings, etiology probably soft tissue injury or tendon or ligament irritation, discussed this with patient, ankle brace applied in office, to be used for stability and support, discussed to complete some activity without brace to atrophy, will inject prednisone.  Flexeril Discussed additional supportive measures, advised follow-up with orthopedics in 1 week if no symptoms improved work note given   interpretation completed by family, declined formal interpreter Final Clinical Impressions(s) / UC Diagnoses   Final diagnoses:  Acute  right ankle pain     Discharge Instructions      Your x-ray today did not show injury to the bone of your ankle. Your pain is most likely being caused by irritation to the soft tissues or tendon, this should improve as time progresses. Unable to visualize tendons and ligaments on x-ray   You have been given an injection of Toradol today here in the office, ideally will see improvement in about 30 minutes, may take Tylenol for remainder of day  Starting tomorrow take prednisone every morning with food for 5 days to inflammation and swelling and help with pain, may take Tylenol while using this medication  May use muscle relaxer as needed at bedtime for comfort, be mindful of this will make you feel drowsy  You may apply heat or ice, whichever makes you feel better, to affected area in 15 minute intervals  You may continue activity as tolerated, there is no injury therefore, it is important that you continue to move around so you do not loose strength to the area  You may use ankle brace as needed for stability and support with activities, may remove at rest  If symptoms persist past 1 week, you may follow up at urgent care or with orthopedic specialist for evaluation, an orthopedic doctor specializes in the bone, they may provide  management such as but not limited to imaging, long term medications and physical therapy    ED Prescriptions   None    PDMP not reviewed this encounter.   Hans Eden, NP 12/14/22 1240

## 2022-12-22 NOTE — ED Provider Notes (Signed)
[facility]       CSN: [id number] Arrival date & time:          History              Chief Complaint     Chief Complaint  Patient presents with   Ankle Pain      Entered by patient      HPI Roneisha Urbain is a 42 y.o. female.    Patient presents for evaluation of right-sided ankle pain present for 10 days without precipitating event, trauma or injury.  Endorses symptoms are mainly along the medial aspect radiating into the upper leg.  Pain is worse when bearing weight but able to complete movements.  Endorses tingling present to all 5 toes.  Endorses symptoms feel similar to when she had a tendon sprain approximately 1 year ago.  Has not attempted treatment   No past medical history on file.   There are no problems to display for this patient.       OB History   No obstetric history on file.          Home Medications                Prior to Admission medications   Not on File      Family History No family history on file.   Social History     Allergies              Patient has no allergy information on record.     Review of Systems Review of Systems     Physical Exam Triage Vital Signs    ED Triage Vitals  Enc Vitals Group     BP       Pulse       Resp       Temp       Temp src       SpO2       Weight       Height       Head Circumference       Peak Flow       Pain Score       Pain Loc       Pain Edu?       Excl. in [place]?      No data found.   Updated Vital Signs LMP 11/25/2022    Visual Acuity Right Eye Distance:      Left Eye Distance:         Bilateral Distance:           Right Eye Near:            Left Eye Near:               Bilateral Near:                 Physical Exam Constitutional:      Appearance: Normal appearance.  Eyes:     Extraocular Movements: Extraocular movements intact.  Pulmonary:     Effort: Pulmonary effort is normal.  Musculoskeletal:     Comments: Tenderness is present over the medial  malleolus without swelling, ecchymosis or deformity, able to bear weight but pain is elicited, able to complete range of motion but pain is elicited with all movements, sensations intact, 2+ pedal pulse  Skin:    General: Skin is warm and dry.  Neurological:     Mental Status: She is alert and oriented to person, place,  and time. Mental status is at baseline.          UC Treatments / Results  Labs (all labs ordered are listed, but only abnormal results are displayed) Labs Reviewed - No data to display   EKG     Radiology  Imaging Results (Last 48 hours)  DG Ankle Complete Right   Result Date: 12/14/2022 CLINICAL DATA:  42 year old female with medial ankle pain. EXAM: RIGHT ANKLE - COMPLETE 3+ VIEW COMPARISON:  None Available. FINDINGS: Bone mineralization is within normal limits. Mortise joint alignment preserved. Talar dome intact. There is no evidence of fracture, dislocation, or joint effusion. Calcaneus intact. No significant joint degeneration. Soft tissue contours appear normal. IMPRESSION: Negative. Electronically Signed   By: Genevie Ann M.D.   On: 12/14/2022 11:56       Procedures Procedures (including critical care time)   Medications Ordered in UC Medications - No data to display   Initial Impression / Assessment and Plan / UC Course  I have reviewed the triage vital signs and the nursing notes.   Pertinent labs & imaging results that were available during my care of the patient were reviewed by me and considered in my medical decision making (see chart for details).   Acute right ankle pain   X-ray negative, discussed findings, etiology probably soft tissue injury or tendon or ligament irritation, discussed this with patient, ankle brace applied in office, to be used for stability and support, discussed to complete some activity without brace to atrophy, will inject prednisone.  Flexeril Discussed additional supportive measures, advised follow-up with orthopedics in 1  week if no symptoms improved work note given    interpretation completed by family, declined formal interpreter Final Clinical Impressions(s) / UC Diagnoses    Final diagnoses:  Acute right ankle pain        Discharge Instructions        Your x-ray today did not show injury to the bone of your ankle. Your pain is most likely being caused by irritation to the soft tissues or tendon, this should improve as time progresses. Unable to visualize tendons and ligaments on x-ray    You have been given an injection of Toradol today here in the office, ideally will see improvement in about 30 minutes, may take Tylenol for remainder of day   Starting tomorrow take prednisone every morning with food for 5 days to inflammation and swelling and help with pain, may take Tylenol while using this medication   May use muscle relaxer as needed at bedtime for comfort, be mindful of this will make you feel drowsy   You may apply heat or ice, whichever makes you feel better, to affected area in 15 minute intervals   You may continue activity as tolerated, there is no injury therefore, it is important that you continue to move around so you do not loose strength to the area   You may use ankle brace as needed for stability and support with activities, may remove at rest   If symptoms persist past 1 week, you may follow up at urgent care or with orthopedic specialist for evaluation, an orthopedic doctor specializes in the bone, they may provide  management such as but not limited to imaging, long term medications and physical therapy      ED Prescriptions   None      PDMP not reviewed this encounter.   Hans Eden, NP 12/14/22 1240     Hans Eden, NP  12/22/22 1642  

## 2023-03-30 ENCOUNTER — Ambulatory Visit
Admission: RE | Admit: 2023-03-30 | Discharge: 2023-03-30 | Disposition: A | Discharge status: Home / Self Care | Payer: Commercial Managed Care - PPO | Source: Ambulatory Visit

## 2023-03-30 VITALS — BP 112/77 | HR 80 | Temp 98.5°F | Ht 62.0 in | Wt 160.0 lb

## 2023-03-30 DIAGNOSIS — M778 Other enthesopathies, not elsewhere classified: Secondary | ICD-10-CM

## 2023-03-30 MED ORDER — PREDNISONE 20 MG PO TABS: 40.0000 mg | ORAL_TABLET | Freq: Every day | ORAL | 0 refills | Status: DC

## 2023-03-30 MED ORDER — KETOROLAC TROMETHAMINE 30 MG/ML IJ SOLN
30.0000 mg | Freq: Once | INTRAMUSCULAR | Status: AC
  Administered 2023-03-30: 30 mg via INTRAMUSCULAR

## 2023-03-30 MED ORDER — CYCLOBENZAPRINE HCL 10 MG PO TABS: 10.0000 mg | ORAL_TABLET | Freq: Every day | ORAL | 0 refills | Status: DC

## 2023-03-30 NOTE — ED Provider Notes (Signed)
[facility]    CSN: [id number] Arrival date & time: 03/30/23  7829      History   Chief Complaint Chief Complaint  Patient presents with   Arm Injury    Entered by patient    HPI Jaclyn Jackson is a 42 y.o. female.   Patient presents for evaluation of pain to the right elbow for 5 days.  Pain is to constant, does not radiate, rated a 10 out of 10, can be felt with both extension and flexion.  Denies injury or trauma but endorses that she does a lot of lifting as she works for [employer], started approximately 1 month ago.  Has attempted use of ibuprofen which has been ineffective.  Denies numbness or tingling.    Spanish interpreter used  History reviewed. No pertinent past medical history.  There are no problems to display for this patient.   Past Surgical History:  Procedure Laterality Date   ABDOMINAL SURGERY     CESAREAN SECTION     TUBAL LIGATION      OB History   No obstetric history on file.      Home Medications    Prior to Admission medications   Medication Sig Start Date End Date Taking? Authorizing Provider  gabapentin (NEURONTIN) 100 MG capsule TAKE 2 TABLETS BY MOUTH IN THE MORNING   Yes [provider]  gabapentin (NEURONTIN) 300 MG capsule Take 300 mg in the morning and  600 mg at night 03/05/23  Yes [provider]  gabapentin (NEURONTIN) 600 MG tablet Take by mouth. 03/08/23 04/07/23 Yes [provider]  cyclobenzaprine (FLEXERIL) 10 MG tablet Take 1 tablet (10 mg total) by mouth at bedtime. 12/14/22   Tenessa Marsee, Elita Boone, NP  dicyclomine (BENTYL) 10 MG capsule Take 1 capsule (10 mg total) by mouth 4 (four) times daily as needed for up to 14 days for spasms. 01/25/20 02/08/20  Triplett, Rulon Eisenmenger B, FNP  doxycycline (VIBRAMYCIN) 100 MG capsule Take 1 capsule (100 mg total) by mouth 2 (two) times daily. 06/07/15   Emily Filbert, MD  doxycycline (VIBRAMYCIN) 100 MG capsule Take 1 capsule (100 mg total) by mouth 2 (two) times  daily. 11/22/18   Emily Filbert, MD  metroNIDAZOLE (FLAGYL) 500 MG tablet Take 1 tablet (500 mg total) by mouth 2 (two) times daily. 06/07/15   Emily Filbert, MD  metroNIDAZOLE (FLAGYL) 500 MG tablet Take 1 tablet (500 mg total) by mouth 3 (three) times daily. 11/22/18   Emily Filbert, MD  oxyCODONE-acetaminophen (ROXICET) 5-325 MG per tablet Take 1 tablet by mouth every 6 (six) hours as needed. 06/07/15   Emily Filbert, MD  predniSONE (DELTASONE) 20 MG tablet Take 2 tablets (40 mg total) by mouth daily. 12/14/22   Valinda Hoar, NP    Family History History reviewed. No pertinent family history.  Social History Social History   Tobacco Use   Smoking status: Never   Smokeless tobacco: Never  Vaping Use   Vaping Use: Never used  Substance Use Topics   Alcohol use: No   Drug use: Never     Allergies   Acetaminophen   Review of Systems Review of Systems   Physical Exam Triage Vital Signs ED Triage Vitals  Enc Vitals Group     BP 03/30/23 1009 112/77     Pulse Rate 03/30/23 1009 80     Resp --      Temp 03/30/23 1009 98.5 F (36.9 C)  Temp Source 03/30/23 1009 Oral     SpO2 03/30/23 1009 100 %     Weight 03/30/23 1005 160 lb (72.6 kg)     Height 03/30/23 1005 5\' 2"  (1.575 m)     Head Circumference --      Peak Flow --      Pain Score 03/30/23 1000 10     Pain Loc --      Pain Edu? --      Excl. in GC? --    No data found.  Updated Vital Signs BP 112/77 (BP Location: Left Arm)   Pulse 80   Temp 98.5 F (36.9 C) (Oral)   Ht 5\' 2"  (1.575 m)   Wt 160 lb (72.6 kg)   LMP 03/23/2023   SpO2 100%   BMI 29.26 kg/m   Visual Acuity Right Eye Distance:   Left Eye Distance:   Bilateral Distance:    Right Eye Near:   Left Eye Near:    Bilateral Near:     Physical Exam Constitutional:      Appearance: Normal appearance.  Eyes:     Extraocular Movements: Extraocular movements intact.  Pulmonary:     Effort: Pulmonary effort is  normal.  Musculoskeletal:     Comments: Tenderness is present along the lateral aspect of the right elbow without swelling, ecchymosis or deformity, able to complete range of motion, 2+ brachial pulse, strength is a 5 out of 5  Neurological:     Mental Status: She is alert and oriented to person, place, and time. Mental status is at baseline.      UC Treatments / Results  Labs (all labs ordered are listed, but only abnormal results are displayed) Labs Reviewed - No data to display  EKG   Radiology No results found.  Procedures Procedures (including critical care time)  Medications Ordered in UC Medications - No data to display  Initial Impression / Assessment and Plan / UC Course  I have reviewed the triage vital signs and the nursing notes.  Pertinent labs & imaging results that were available during my care of the patient were reviewed by me and considered in my medical decision making (see chart for details).  Elbow tendinitis  Etiology is most likely irritation to the tendon and ligament due to overuse, low suspicion for bone involvement therefore imaging deferred, discussed this with patient, Toradol injection given in office and prescribed prednisone and Flexeril for outpatient use, discussed administration, recommended RICE heat massage stretching and activity as tolerated, walking referral given to orthopedics if symptoms persist or worsen, work note given Final Clinical Impressions(s) / UC Diagnoses   Final diagnoses:  None   Discharge Instructions   None    ED Prescriptions   None    PDMP not reviewed this encounter.   Valinda Hoar, NP 03/30/23 1110

## 2023-03-30 NOTE — Discharge Instructions (Addendum)
Lo ms probable es que su dolor sea causado por la irritacin de los tendones debido al uso excesivo.  Si se le administra una inyeccin de Toradol para ayudar a Physicist, medical, lo ideal es que comience a Radiation protection practitioner en unos 30 minutos.  A partir de [place], tome prednisona todas las maanas con alimentos para ayudar a Social research officer, government y Engineer, materials. Puede tomar Tylenol cada 6 horas segn sea necesario para mayor comodidad.  Tambin puede usar un relajante muscular a la hora de acostarse segn sea necesario, ya que posiblemente le produzca ms somnolencia.  Puede usar una almohadilla trmica en intervalos de 15 minutos segn sea necesario para mayor comodidad, puede sentirse cmodo usando hielo en 10 a 15 minutos sobre el rea afectada.  Comience a estirar el rea afectada diariamente durante 10 minutos segn lo tolere para aflojar an ms los msculos.   Cuando est acostado, coloque una almohada debajo del brazo como apoyo.   Practique una buena postura: Carolyne Fiscal atrs, hombros hacia atrs, pecho hacia adelante, pelvis hacia atrs y peso distribuido uniformemente en ambas piernas.  Si el dolor persiste despus del tratamiento recomendado o reaparece, puede ser beneficioso realizar un seguimiento con un especialista en ortopedia para Stage manager, este mdico se especializa en los [employer] y Magazine features editor sus sntomas a Air cabin crew con opciones que [employer], [place] otras, imgenes, medicamentos o fisioterapia.   Your pain is most likely caused by irritation to the tendons due to overuse  Given injection of Toradol to help manage her pain ideally will start to see relief in about 30 minutes  Starting tomorrow take prednisone every morning with food to help reduce inflammation and to help with your pain, you may take Tylenol every 6 hours as needed for additional comfort  May use muscle relaxant at bedtime as needed as well, may possibly make you more drowsy  You may  use heating pad in 15 minute intervals as needed for additional comfort,  you may find comfort in using ice in 10-15 minutes over affected area  Begin stretching affected area daily for 10 minutes as tolerated to further loosen muscles   When lying down place pillow underneath arm for support   Practice good posture: head back, shoulders back, chest forward, pelvis back and weight distributed evenly on both legs  If pain persist after recommended treatment or reoccurs if may be beneficial to follow up with orthopedic specialist for evaluation, this doctor specializes in the bones and can manage your symptoms long-term with options such as but not limited to imaging, medications or physical therapy

## 2023-03-30 NOTE — ED Triage Notes (Signed)
Used Interpreter Luretha Murphy [phone] and Darien Ramus [phone]  Pt c/o pain in her right forearm and elbow x5day  Pt states that she works at [employer] and has been working for 1 month. 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   Pt states that it hurts to move or raise her arm.   Pt has been taking ibuprofen for the pain.

## 2023-04-23 ENCOUNTER — Ambulatory Visit
Admission: RE | Admit: 2023-04-23 | Discharge: 2023-04-23 | Disposition: A | Discharge status: Home / Self Care | Payer: Commercial Managed Care - PPO | Source: Ambulatory Visit | Attending: Physician Assistant | Admitting: Physician Assistant

## 2023-04-23 VITALS — BP 126/85 | HR 77 | Temp 98.9°F | Resp 16

## 2023-04-23 DIAGNOSIS — K120 Recurrent oral aphthae: Secondary | ICD-10-CM

## 2023-04-23 MED ORDER — LIDOCAINE VISCOUS HCL 2 % MT SOLN: 15.0000 mL | OROMUCOSAL | 0 refills | Status: DC | PRN

## 2023-04-23 NOTE — ED Provider Notes (Signed)
[facility]    CSN: [id number] Arrival date & time: 04/23/23  1328      History   Chief Complaint Chief Complaint  Patient presents with   Dental Problem    Entered by patient    HPI Jaclyn Jackson is a 42 y.o. female presenting for sore throat and a sore in the back of her throat x 3 days.  Reports painful swallowing.  Denies fever, fatigue, body aches, cough, congestion.  Denies pain of her teeth.  Has not taken any OTC meds.  Denies similar problems in the past.  No other complaints.  Language interpreter service used to communicate with patient.  HPI  History reviewed. No pertinent past medical history.  There are no problems to display for this patient.   Past Surgical History:  Procedure Laterality Date   ABDOMINAL SURGERY     CESAREAN SECTION     TUBAL LIGATION      OB History   No obstetric history on file.      Home Medications    Prior to Admission medications   Medication Sig Start Date End Date Taking? Authorizing Provider  lidocaine (XYLOCAINE) 2 % solution Use as directed 15 mLs in the mouth or throat every 3 (three) hours as needed for mouth pain (swish and spit). 04/23/23  Yes Eusebio Friendly B, PA-C  cyclobenzaprine (FLEXERIL) 10 MG tablet Take 1 tablet (10 mg total) by mouth at bedtime. 03/30/23   White, Elita Boone, NP  dicyclomine (BENTYL) 10 MG capsule Take 1 capsule (10 mg total) by mouth 4 (four) times daily as needed for up to 14 days for spasms. 01/25/20 02/08/20  Triplett, Rulon Eisenmenger B, FNP  doxycycline (VIBRAMYCIN) 100 MG capsule Take 1 capsule (100 mg total) by mouth 2 (two) times daily. 06/07/15   Emily Filbert, MD  doxycycline (VIBRAMYCIN) 100 MG capsule Take 1 capsule (100 mg total) by mouth 2 (two) times daily. 11/22/18   Emily Filbert, MD  gabapentin (NEURONTIN) 100 MG capsule TAKE 2 TABLETS BY MOUTH IN THE MORNING    [provider]  gabapentin (NEURONTIN) 300 MG capsule Take 300 mg in the morning and  600 mg at  night 03/05/23   [provider]  gabapentin (NEURONTIN) 600 MG tablet Take by mouth. 03/08/23 04/07/23  [provider]  metroNIDAZOLE (FLAGYL) 500 MG tablet Take 1 tablet (500 mg total) by mouth 2 (two) times daily. 06/07/15   Emily Filbert, MD  metroNIDAZOLE (FLAGYL) 500 MG tablet Take 1 tablet (500 mg total) by mouth 3 (three) times daily. 11/22/18   Emily Filbert, MD  oxyCODONE-acetaminophen (ROXICET) 5-325 MG per tablet Take 1 tablet by mouth every 6 (six) hours as needed. 06/07/15   Emily Filbert, MD  predniSONE (DELTASONE) 20 MG tablet Take 2 tablets (40 mg total) by mouth daily. 03/30/23   Valinda Hoar, NP    Family History History reviewed. No pertinent family history.  Social History Social History   Tobacco Use   Smoking status: Never   Smokeless tobacco: Never  Vaping Use   Vaping Use: Never used  Substance Use Topics   Alcohol use: No   Drug use: Never     Allergies   Acetaminophen and Hydrocodone-acetaminophen   Review of Systems Review of Systems  Constitutional:  Negative for fatigue and fever.  HENT:  Positive for mouth sores and sore throat. Negative for congestion, dental problem and rhinorrhea.   Respiratory:  Negative for cough.  Neurological:  Negative for weakness.  Hematological:  Negative for adenopathy.     Physical Exam Triage Vital Signs ED Triage Vitals [04/23/23 1418]  Enc Vitals Group     BP 126/85     Pulse Rate 77     Resp 16     Temp 98.9 F (37.2 C)     Temp Source Oral     SpO2 98 %     Weight      Height      Head Circumference      Peak Flow      Pain Score 10     Pain Loc      Pain Edu?      Excl. in GC?    No data found.  Updated Vital Signs BP 126/85 (BP Location: Left Arm)   Pulse 77   Temp 98.9 F (37.2 C) (Oral)   Resp 16   LMP 04/16/2023 (Approximate)   SpO2 98%     Physical Exam Vitals and nursing note reviewed.  Constitutional:      General: She is not in acute  distress.    Appearance: Normal appearance. She is not ill-appearing or toxic-appearing.  HENT:     Head: Normocephalic and atraumatic.     Nose: Nose normal.     Mouth/Throat:     Mouth: Mucous membranes are moist.     Pharynx: Oropharynx is clear. Posterior oropharyngeal erythema present.     Comments: +aphthous ulcer left posterior pharynx Eyes:     General: No scleral icterus.       Right eye: No discharge.        Left eye: No discharge.     Conjunctiva/sclera: Conjunctivae normal.  Cardiovascular:     Rate and Rhythm: Normal rate and regular rhythm.     Heart sounds: Normal heart sounds.  Pulmonary:     Effort: Pulmonary effort is normal. No respiratory distress.     Breath sounds: Normal breath sounds.  Musculoskeletal:     Cervical back: Neck supple.  Skin:    General: Skin is dry.  Neurological:     General: No focal deficit present.     Mental Status: She is alert. Mental status is at baseline.     Motor: No weakness.     Gait: Gait normal.  Psychiatric:        Mood and Affect: Mood normal.        Behavior: Behavior normal.        Thought Content: Thought content normal.      UC Treatments / Results  Labs (all labs ordered are listed, but only abnormal results are displayed) Labs Reviewed - No data to display  EKG   Radiology No results found.  Procedures Procedures (including critical care time)  Medications Ordered in UC Medications - No data to display  Initial Impression / Assessment and Plan / UC Course  I have reviewed the triage vital signs and the nursing notes.  Pertinent labs & imaging results that were available during my care of the patient were reviewed by me and considered in my medical decision making (see chart for details).   42 year old female presents for sore throat and oral lesion for the past 3 days.  On exam she has an aphthous ulcer and mild posterior pharyngeal erythema.  Suspect viral illness versus ulceration due to  injury.  Supportive care encouraged with increasing rest and fluids.  Sent viscous lidocaine to pharmacy and advised ibuprofen, Tylenol, Chloraseptic  spray.  Reviewed return precautions.   Final Clinical Impressions(s) / UC Diagnoses   Final diagnoses:  Oral aphthous ulcer     Discharge Instructions      -Tiene una lcera aftosa o afta.  -Aumentar el descanso y los lquidos. Tome Motrin para Chief Technology Officer.  -Prueba el spray clorasptico -Le envi un enjuague bucal anestsico. -Volver si tiene fiebre o no mejora en una semana.  -You have an apthous ulcer/canker sore.  -Increase rest and fluids. Take Motrin for pain.  -Try chloraseptic spray -I sent a numbing mouthwash -Return if fever or not better in a week.     ED Prescriptions     Medication Sig Dispense Auth. Provider   lidocaine (XYLOCAINE) 2 % solution Use as directed 15 mLs in the mouth or throat every 3 (three) hours as needed for mouth pain (swish and spit). 100 mL Shirlee Latch, PA-C      PDMP not reviewed this encounter.   Shirlee Latch, PA-C 04/23/23 1553

## 2023-04-23 NOTE — ED Triage Notes (Signed)
Patient presents to [facility] for dental pain since Friday. No OTC meds for pain.   Denies fever.

## 2023-04-23 NOTE — Discharge Instructions (Addendum)
-  Tiene una lcera aftosa o afta.  -Aumentar el descanso y los lquidos. Tome Motrin para Chief Technology Officer.  -Prueba el spray clorasptico -Le envi un enjuague bucal anestsico. -Volver si tiene fiebre o no mejora en una semana.  -You have an apthous ulcer/canker sore.  -Increase rest and fluids. Take Motrin for pain.  -Try chloraseptic spray -I sent a numbing mouthwash -Return if fever or not better in a week.

## 2023-05-17 ENCOUNTER — Other Ambulatory Visit: Payer: Self-pay

## 2023-05-17 ENCOUNTER — Emergency Department: Payer: Commercial Managed Care - PPO

## 2023-05-17 ENCOUNTER — Emergency Department
Admission: EM | Admit: 2023-05-17 | Discharge: 2023-05-17 | Disposition: A | Discharge status: Home / Self Care | Payer: Commercial Managed Care - PPO | Attending: Emergency Medicine | Admitting: Emergency Medicine

## 2023-05-17 DIAGNOSIS — F419 Anxiety disorder, unspecified: Secondary | ICD-10-CM | POA: Diagnosis not present

## 2023-05-17 DIAGNOSIS — R0602 Shortness of breath: Secondary | ICD-10-CM | POA: Insufficient documentation

## 2023-05-17 DIAGNOSIS — R0789 Other chest pain: Secondary | ICD-10-CM | POA: Insufficient documentation

## 2023-05-17 LAB — BASIC METABOLIC PANEL
Anion gap: 11 (ref 5–15)
BUN: 20 mg/dL (ref 6–20)
CO2: 19 mmol/L — ABNORMAL LOW (ref 22–32)
Calcium: 9.4 mg/dL (ref 8.9–10.3)
Chloride: 106 mmol/L (ref 98–111)
Creatinine, Ser: 1.17 mg/dL — ABNORMAL HIGH (ref 0.44–1.00)
GFR, Estimated: >60 mL/min (ref >60)
Glucose, Bld: 95 mg/dL (ref 70–99)
Potassium: 3 mmol/L — ABNORMAL LOW (ref 3.5–5.1)
Sodium: 136 mmol/L (ref 135–145)

## 2023-05-17 LAB — CBC
HCT: 32.7 % — ABNORMAL LOW (ref 36.0–46.0)
Hemoglobin: 10.5 g/dL — ABNORMAL LOW (ref 12.0–15.0)
MCH: 23.8 pg — ABNORMAL LOW (ref 26.0–34.0)
MCHC: 32.1 g/dL (ref 30.0–36.0)
MCV: 74 fL — ABNORMAL LOW (ref 80.0–100.0)
Platelets: 241 10*3/uL (ref 150–400)
RBC: 4.42 MIL/uL (ref 3.87–5.11)
RDW: 16.5 % — ABNORMAL HIGH (ref 11.5–15.5)
WBC: 6.8 10*3/uL (ref 4.0–10.5)
nRBC: 0 % (ref 0.0–0.2)

## 2023-05-17 LAB — TROPONIN I (HIGH SENSITIVITY): Troponin I (High Sensitivity): 2 ng/L (ref <18)

## 2023-05-17 LAB — D-DIMER, QUANTITATIVE: D-Dimer, Quant: 0.47 ug/mL-FEU (ref 0.00–0.50)

## 2023-05-17 MED ORDER — IPRATROPIUM-ALBUTEROL 0.5-2.5 (3) MG/3ML IN SOLN
3.0000 mL | Freq: Once | RESPIRATORY_TRACT | Status: AC
  Administered 2023-05-17: 3 mL via RESPIRATORY_TRACT
  Filled 2023-05-17: qty 3

## 2023-05-17 MED ORDER — DIPHENHYDRAMINE HCL 50 MG/ML IJ SOLN
25.0000 mg | Freq: Once | INTRAMUSCULAR | Status: AC
  Administered 2023-05-17: 25 mg via INTRAVENOUS
  Filled 2023-05-17: qty 1

## 2023-05-17 MED ORDER — LORAZEPAM 2 MG/ML IJ SOLN
0.5000 mg | Freq: Once | INTRAMUSCULAR | Status: AC
  Administered 2023-05-17: 0.5 mg via INTRAVENOUS
  Filled 2023-05-17: qty 1

## 2023-05-17 MED ORDER — METHYLPREDNISOLONE SODIUM SUCC 125 MG IJ SOLR
125.0000 mg | Freq: Once | INTRAMUSCULAR | Status: AC
  Administered 2023-05-17: 125 mg via INTRAVENOUS
  Filled 2023-05-17: qty 2

## 2023-05-17 NOTE — ED Provider Notes (Signed)
[facility] Provider Note    Event Date/Time   First MD Initiated Contact with Patient 05/17/23 1925     (approximate)   History   Chest Pain   HPI  Jaclyn Jackson is a 42 y.o. female who presents with complaints of chest pain and shortness of breath.  Reportedly symptoms started yesterday, patient had to leave work because of them.  This morning continued to complain of chest discomfort and having rapid breathing.  Daughter reports no reports of fevers or chills.  No history of anaphylaxis or allergic reactions.  No history of PE or DVT.  No history of smoking or asthma     Physical Exam   Triage Vital Signs: ED Triage Vitals  Encounter Vitals Group     BP 05/17/23 1921 120/72     Systolic BP Percentile --      Diastolic BP Percentile --      Pulse Rate 05/17/23 1921 88     Resp 05/17/23 1921 (!) 26     Temp 05/17/23 1921 97.9 F (36.6 C)     Temp Source 05/17/23 1921 Oral     SpO2 05/17/23 1921 100 %     Weight 05/17/23 1919 70.3 kg (155 lb)     Height 05/17/23 1919 1.575 m (5\' 2" )     Head Circumference --      Peak Flow --      Pain Score 05/17/23 1919 10     Pain Loc --      Pain Education --      Exclude from Growth Chart --     Most recent vital signs: Vitals:   05/17/23 2200 05/17/23 2225  BP: 120/76 113/81  Pulse: 88 84  Resp: 19 18  Temp:    SpO2: 100% 100%     General: Awake, tachypneic and appears anxious CV:  Good peripheral perfusion.  No tachycardia Resp:  Tachypnea, lungs are clear to auscultation bilaterally, no wheezing.  No stridor, pharynx appears normal Abd:  No distention.  Other:  No lower extremity swelling or pain   ED Results / Procedures / Treatments   Labs (all labs ordered are listed, but only abnormal results are displayed) Labs Reviewed  BASIC METABOLIC PANEL - Abnormal; Notable for the following components:      Result Value   Potassium 3.0 (*)    CO2 19 (*)    Creatinine, Ser 1.17 (*)     All other components within normal limits  CBC - Abnormal; Notable for the following components:   Hemoglobin 10.5 (*)    HCT 32.7 (*)    MCV 74.0 (*)    MCH 23.8 (*)    RDW 16.5 (*)    All other components within normal limits  D-DIMER, QUANTITATIVE  POC URINE PREG, ED  TROPONIN I (HIGH SENSITIVITY)     EKG  ED ECG REPORT I, Jene Every, the attending physician, personally viewed and interpreted this ECG.  Date: 05/17/2023  Rhythm: normal sinus rhythm QRS Axis: normal Intervals: normal ST/T Wave abnormalities: normal Narrative Interpretation: no evidence of acute ischemia    RADIOLOGY Chest x-ray viewed interpret by me, no acute abnormality    PROCEDURES:  Critical Care performed:   Procedures   MEDICATIONS ORDERED IN ED: Medications  ipratropium-albuterol (DUONEB) 0.5-2.5 (3) MG/3ML nebulizer solution 3 mL (3 mLs Nebulization Given 05/17/23 1951)  diphenhydrAMINE (BENADRYL) injection 25 mg (25 mg Intravenous Given 05/17/23 1952)  methylPREDNISolone sodium succinate (SOLU-MEDROL) 125  mg/2 mL injection 125 mg (125 mg Intravenous Given 05/17/23 1952)  LORazepam (ATIVAN) injection 0.5 mg (0.5 mg Intravenous Given 05/17/23 2059)     IMPRESSION / MDM / ASSESSMENT AND PLAN / ED COURSE  I reviewed the triage vital signs and the nursing notes. Patient's presentation is most consistent with acute presentation with potential threat to life or bodily function.  Patient presents with shortness of breath, chest discomfort as detailed above.  Differential includes ACS, pneumonia, PE, allergic reaction, anxiety  Patient markedly tachypneic upon arrival however oxygen saturations are reassuring well, lungs clear to auscultation, no evidence of stridor or allergic reaction.  No hives/urticaria  Regardless we will give dose of IV Benadryl, IV Solu-Medrol, DuoNeb  Obtain chest x-ray, labs including high sensitive troponin.  EKG is quite reassuring, not consistent with  ACS.  D-dimer sent as well  Additional dose of 0.5 mg of IV Ativan given  Lab work is unremarkable, x-ray is normal.  After treatment patient is at her baseline and feels quite well.  Suspicious for anxiety reaction.  No indication for admission at this time, outpatient follow-up recommended, cardiology referral placed.      FINAL CLINICAL IMPRESSION(S) / ED DIAGNOSES   Final diagnoses:  Anxiety  Atypical chest pain     Rx / DC Orders   ED Discharge Orders          Ordered    Ambulatory referral to Cardiology       Comments: If you have not heard from the Cardiology office within the next 72 hours please call [phone].   05/17/23 2215             Note:  This document was prepared using Dragon voice recognition software and may include unintentional dictation errors.   Jene Every, MD 05/17/23 [phone]

## 2023-05-17 NOTE — ED Notes (Signed)
Discharge instructions provided by edp were discussed with pt. Pt was able to verbalize understanding with no additional questions at this time.

## 2023-05-17 NOTE — ED Triage Notes (Addendum)
Pt to ED via POV c/o chest pain that started yesterday at 8pm and has been getting worse since. Pt reports stabbing pain in left chest, non radiating. Endorses SOB with chest pain. Pt unable to speak in complete sentences, tachypneic in triage. Denies N/V

## 2023-07-26 ENCOUNTER — Ambulatory Visit: Payer: Commercial Managed Care - PPO | Admitting: Cardiology

## 2023-08-20 ENCOUNTER — Encounter: Payer: Self-pay | Admitting: Cardiology

## 2023-08-20 ENCOUNTER — Ambulatory Visit: Payer: BLUE CROSS/BLUE SHIELD | Attending: Cardiology | Admitting: Cardiology

## 2023-08-20 VITALS — BP 108/76 | HR 69 | Ht 62.0 in | Wt 148.0 lb

## 2023-08-20 DIAGNOSIS — R0602 Shortness of breath: Secondary | ICD-10-CM | POA: Diagnosis not present

## 2023-08-20 DIAGNOSIS — R072 Precordial pain: Secondary | ICD-10-CM

## 2023-08-20 MED ORDER — METOPROLOL TARTRATE 100 MG PO TABS: 100.0000 mg | ORAL_TABLET | Freq: Once | ORAL | 0 refills | Status: DC

## 2023-08-20 NOTE — Patient Instructions (Signed)
Medication Instructions:   Your physician recommends that you continue on your current medications as directed. Please refer to the Current Medication list given to you today.  *If you need a refill on your cardiac medications before your next appointment, please call your pharmacy*   Lab Work:  Your physician recommends you have labs today - BMP  If you have labs (blood work) drawn today and your tests are completely normal, you will receive your results only by: MyChart Message (if you have MyChart) OR A paper copy in the mail If you have any lab test that is abnormal or we need to change your treatment, we will call you to review the results.   Testing/Procedures:  Your physician has requested that you have an echocardiogram. Echocardiography is a painless test that uses sound waves to create images of your heart. It provides your doctor with information about the size and shape of your heart and how well your heart's chambers and valves are working. This procedure takes approximately one hour. There are no restrictions for this procedure. Please do NOT wear cologne, perfume, aftershave, or lotions (deodorant is allowed). Please arrive 15 minutes prior to your appointment time.    Your cardiac CT will be scheduled at one of the below locations:   [facility] [address] [place], [place] [address] [phone]  OR   [facility] Heart and Vascular entrance [address] [place], [place] [address] [phone]  Please follow these instructions carefully (unless otherwise directed):  An IV will be required for this test and Nitroglycerin will be given.  Hold all erectile dysfunction medications at least 3 days (72 hrs) prior to test. (Ie viagra, cialis, sildenafil, tadalafil, etc)   On the Night Before the Test: Be sure to Drink plenty of water. Do not consume any caffeinated/decaffeinated beverages or chocolate 12 hours prior  to your test. Do not take any antihistamines 12 hours prior to your test.  On the Day of the Test: Drink plenty of water until 1 hour prior to the test. Do not eat any food 1 hour prior to test. You may take your regular medications prior to the test.  Take metoprolol (Lopressor) two hours prior to test. FEMALES- please wear underwire-free bra if available, avoid dresses & tight clothing      After the Test: Drink plenty of water. After receiving IV contrast, you may experience a mild flushed feeling. This is normal. On occasion, you may experience a mild rash up to 24 hours after the test. This is not dangerous. If this occurs, you can take Benadryl 25 mg and increase your fluid intake. If you experience trouble breathing, this can be serious. If it is severe call 911 IMMEDIATELY. If it is mild, please call our office. If you take any of these medications: Glipizide/Metformin, Avandament, Glucavance, please do not take 48 hours after completing test unless otherwise instructed.  We will call to schedule your test 2-4 weeks out understanding that some insurance companies will need an authorization prior to the service being performed.   For more information and frequently asked questions, please visit our website : [email or web address]  For non-scheduling related questions, please contact the cardiac imaging nurse navigator should you have any questions/concerns: Cardiac Imaging Nurse Navigators Direct Office Dial: [phone]   For scheduling needs, including cancellations and rescheduling, please call [place], [phone].   Follow-Up: At [facility], you and your health needs are our priority.  As part  of our continuing mission to provide you with exceptional heart care, we have created designated Provider Care Teams.  These Care Teams include your primary Cardiologist (physician) and Advanced Practice Providers (APPs -  Physician Assistants and Nurse  Practitioners) who all work together to provide you with the care you need, when you need it.  We recommend signing up for the patient portal called "MyChart".  Sign up information is provided on this After Visit Summary.  MyChart is used to connect with patients for Virtual Visits (Telemedicine).  Patients are able to view lab/test results, encounter notes, upcoming appointments, etc.  Non-urgent messages can be sent to your provider as well.   To learn more about what you can do with MyChart, go to [email or web address].    Your next appointment:   3 month(s)  Provider:   You may see Debbe Odea, MD or one of the following Advanced Practice Providers on your designated Care Team:   Nicolasa Ducking, NP Eula Listen, PA-C Cadence Fransico Michael, PA-C Charlsie Quest, NP

## 2023-08-20 NOTE — Progress Notes (Signed)
Cardiology Office Note:    Date:  08/20/2023   ID:  Jaclyn Jackson, DOB [birth year], MRN [id number]  PCP:  Preston Fleeting, MD   [facility] Providers Cardiologist:  Debbe Odea, MD     Referring MD: Preston Fleeting*   Chief Complaint  Patient presents with   New Patient (Initial Visit)    Referred by ED for cardiac evaluation for chest pain.  Patient presented to ED on 05/17/23 with chest pain and SOBr.  Still having intermittent left side chest pian.     Jaclyn Jackson is a 42 y.o. female who is being seen today for the evaluation of chest pain at the request of [place], Presley Raddle*.   History of Present Illness:    Jaclyn Jackson is a 42 y.o. female with no significant past medical history presenting with symptoms of chest pain.  States having chest pain while at work.  She works at a [employer], usually develops chest discomfort when she does not have the fan directed towards her.  Also endorses shortness of breath and fatigue.  Presented to the ED 3 months ago, workup was unrevealing.  Denies any symptoms while not at work.  Father had CAD and bypass surgery around age 60.  She denies smoking.  Dad was a smoker.  History reviewed. No pertinent past medical history.  Past Surgical History:  Procedure Laterality Date   ABDOMINAL SURGERY     CESAREAN SECTION     TUBAL LIGATION      Current Medications: Current Meds  Medication Sig   cyclobenzaprine (FLEXERIL) 10 MG tablet Take 1 tablet (10 mg total) by mouth at bedtime.   gabapentin (NEURONTIN) 300 MG capsule Take 300 mg in the morning and  600 mg at night   gabapentin (NEURONTIN) 600 MG tablet Take by mouth.   metoprolol tartrate (LOPRESSOR) 100 MG tablet Take 1 tablet (100 mg total) by mouth once for 1 dose. TWO HOURS PRIOR TO CARDIAC CTA   nortriptyline (PAMELOR) 10 MG capsule Take 20 mg by mouth daily.     Allergies:   Acetaminophen and Hydrocodone-acetaminophen    Social History   Socioeconomic History   Marital status: Married    Spouse name: Not on file   Number of children: Not on file   Years of education: Not on file   Highest education level: Not on file  Occupational History   Not on file  Tobacco Use   Smoking status: Never   Smokeless tobacco: Never  Vaping Use   Vaping status: Never Used  Substance and Sexual Activity   Alcohol use: No   Drug use: Never   Sexual activity: Not on file  Other Topics Concern   Not on file  Social History Narrative   Not on file   Social Determinants of Health   Financial Resource Strain: Not on file  Food Insecurity: Not on file  Transportation Needs: Not on file  Physical Activity: Not on file  Stress: Not on file  Social Connections: Not on file     Family History: The patient's family history is not on file.  ROS:   Please see the history of present illness.     All other systems reviewed and are negative.  EKGs/Labs/Other Studies Reviewed:    The following studies were reviewed today:  EKG Interpretation Date/Time:  Monday August 20 2023 12:06:08 EDT Ventricular Rate:  69 PR Interval:  154 QRS Duration:  90 QT Interval:  380 QTC Calculation: 407 R Axis:   23  Text Interpretation: Normal sinus rhythm Normal ECG Confirmed by Debbe Odea ([id number]) on 08/20/2023 12:07:47 PM    Recent Labs: 05/17/2023: BUN 20; Creatinine, Ser 1.17; Hemoglobin 10.5; Platelets 241; Potassium 3.0; Sodium 136  Recent Lipid Panel No results found for: "CHOL", "TRIG", "HDL", "CHOLHDL", "VLDL", "LDLCALC", "LDLDIRECT"   Risk Assessment/Calculations:               Physical Exam:    VS:  BP 108/76 (BP Location: Left Arm, Patient Position: Sitting, Cuff Size: Normal)   Pulse 69   Ht 5\' 2"  (1.575 m)   Wt 148 lb (67.1 kg)   SpO2 100%   BMI 27.07 kg/m     Wt Readings from Last 3 Encounters:  08/20/23 148 lb (67.1 kg)  05/17/23 155 lb (70.3 kg)  03/30/23 160 lb (72.6 kg)      GEN:  Well nourished, well developed in no acute distress HEENT: Normal NECK: No JVD; No carotid bruits CARDIAC: RRR, no murmurs, rubs, gallops RESPIRATORY:  Clear to auscultation without rales, wheezing or rhonchi  ABDOMEN: Soft, non-tender, non-distended MUSCULOSKELETAL:  No edema; No deformity  SKIN: Warm and dry NEUROLOGIC:  Alert and oriented x 3 PSYCHIATRIC:  Normal affect   ASSESSMENT:    1. Precordial pain   2. Shortness of breath    PLAN:    In order of problems listed above:  Chest pain, occurring usually at work with diminished quality.  Usually also with shortness of breath.  Family history of early CAD.  Obtain echo, obtain coronary CTA.     Follow-up after cardiac testing.       Medication Adjustments/Labs and Tests Ordered: Current medicines are reviewed at length with the patient today.  Concerns regarding medicines are outlined above.  Orders Placed This Encounter  Procedures   CT CORONARY MORPH W/CTA COR W/SCORE W/CA W/CM &/OR WO/CM   Basic Metabolic Panel (BMET)   EKG 12-Lead   ECHOCARDIOGRAM COMPLETE   Meds ordered this encounter  Medications   metoprolol tartrate (LOPRESSOR) 100 MG tablet    Sig: Take 1 tablet (100 mg total) by mouth once for 1 dose. TWO HOURS PRIOR TO CARDIAC CTA    Dispense:  1 tablet    Refill:  0    Patient Instructions  Medication Instructions:   Your physician recommends that you continue on your current medications as directed. Please refer to the Current Medication list given to you today.  *If you need a refill on your cardiac medications before your next appointment, please call your pharmacy*   Lab Work:  Your physician recommends you have labs today - BMP  If you have labs (blood work) drawn today and your tests are completely normal, you will receive your results only by: MyChart Message (if you have MyChart) OR A paper copy in the mail If you have any lab test that is abnormal or we need to change your  treatment, we will call you to review the results.   Testing/Procedures:  Your physician has requested that you have an echocardiogram. Echocardiography is a painless test that uses sound waves to create images of your heart. It provides your doctor with information about the size and shape of your heart and how well your heart's chambers and valves are working. This procedure takes approximately one hour. There are no restrictions for this procedure. Please do NOT wear cologne, perfume, aftershave, or lotions (deodorant is allowed). Please arrive 64  minutes prior to your appointment time.    Your cardiac CT will be scheduled at one of the below locations:   [facility] [address] [place], [place] [address] [phone]  OR   [facility] Heart and Vascular entrance [address] [place], [place] [address] [phone]  Please follow these instructions carefully (unless otherwise directed):  An IV will be required for this test and Nitroglycerin will be given.  Hold all erectile dysfunction medications at least 3 days (72 hrs) prior to test. (Ie viagra, cialis, sildenafil, tadalafil, etc)   On the Night Before the Test: Be sure to Drink plenty of water. Do not consume any caffeinated/decaffeinated beverages or chocolate 12 hours prior to your test. Do not take any antihistamines 12 hours prior to your test.  On the Day of the Test: Drink plenty of water until 1 hour prior to the test. Do not eat any food 1 hour prior to test. You may take your regular medications prior to the test.  Take metoprolol (Lopressor) two hours prior to test. FEMALES- please wear underwire-free bra if available, avoid dresses & tight clothing      After the Test: Drink plenty of water. After receiving IV contrast, you may experience a mild flushed feeling. This is normal. On occasion, you may experience a mild rash up to 24 hours after the test. This  is not dangerous. If this occurs, you can take Benadryl 25 mg and increase your fluid intake. If you experience trouble breathing, this can be serious. If it is severe call 911 IMMEDIATELY. If it is mild, please call our office. If you take any of these medications: Glipizide/Metformin, Avandament, Glucavance, please do not take 48 hours after completing test unless otherwise instructed.  We will call to schedule your test 2-4 weeks out understanding that some insurance companies will need an authorization prior to the service being performed.   For more information and frequently asked questions, please visit our website : [email or web address]  For non-scheduling related questions, please contact the cardiac imaging nurse navigator should you have any questions/concerns: Cardiac Imaging Nurse Navigators Direct Office Dial: [phone]   For scheduling needs, including cancellations and rescheduling, please call [place], [phone].   Follow-Up: At [facility], you and your health needs are our priority.  As part of our continuing mission to provide you with exceptional heart care, we have created designated Provider Care Teams.  These Care Teams include your primary Cardiologist (physician) and Advanced Practice Providers (APPs -  Physician Assistants and Nurse Practitioners) who all work together to provide you with the care you need, when you need it.  We recommend signing up for the patient portal called "MyChart".  Sign up information is provided on this After Visit Summary.  MyChart is used to connect with patients for Virtual Visits (Telemedicine).  Patients are able to view lab/test results, encounter notes, upcoming appointments, etc.  Non-urgent messages can be sent to your provider as well.   To learn more about what you can do with MyChart, go to [email or web address].    Your next appointment:   3 month(s)  Provider:   You may see Debbe Odea,  MD or one of the following Advanced Practice Providers on your designated Care Team:   Nicolasa Ducking, NP Eula Listen, PA-C Cadence Fransico Michael, PA-C Charlsie Quest, NP   Signed, Debbe Odea, MD  08/20/2023 12:57 PM    [facility]

## 2023-08-21 ENCOUNTER — Other Ambulatory Visit: Payer: Self-pay

## 2023-08-21 DIAGNOSIS — I2510 Atherosclerotic heart disease of native coronary artery without angina pectoris: Secondary | ICD-10-CM

## 2023-08-21 LAB — BASIC METABOLIC PANEL
BUN/Creatinine Ratio: 12 (ref 9–23)
BUN: 11 mg/dL (ref 6–20)
CO2: 23 mmol/L (ref 20–29)
Calcium: 10 mg/dL (ref 8.7–10.2)
Chloride: 103 mmol/L (ref 96–106)
Creatinine, Ser: 0.92 mg/dL (ref 0.57–1.00)
Glucose: 79 mg/dL (ref 70–99)
Potassium: 4.4 mmol/L (ref 3.5–5.2)
Sodium: 139 mmol/L (ref 134–144)
eGFR: 81 mL/min/{1.73_m2} (ref >59)

## 2023-08-23 ENCOUNTER — Ambulatory Visit: Admission: RE | Admit: 2023-08-23 | Payer: BLUE CROSS/BLUE SHIELD | Source: Ambulatory Visit

## 2023-09-06 ENCOUNTER — Other Ambulatory Visit: Payer: BLUE CROSS/BLUE SHIELD

## 2023-09-07 ENCOUNTER — Telehealth: Payer: Self-pay | Admitting: Cardiology

## 2023-09-07 NOTE — Telephone Encounter (Signed)
Husband called to request call back to patient to discuss instructions for upcoming tests.  Husband said can call wife at [phone].

## 2023-09-20 ENCOUNTER — Ambulatory Visit: Payer: Commercial Managed Care - PPO | Admitting: Cardiology

## 2023-09-27 ENCOUNTER — Other Ambulatory Visit: Payer: BLUE CROSS/BLUE SHIELD

## 2023-11-20 ENCOUNTER — Ambulatory Visit: Payer: BLUE CROSS/BLUE SHIELD | Admitting: Cardiology

## 2023-12-03 ENCOUNTER — Ambulatory Visit
Admission: EM | Admit: 2023-12-03 | Discharge: 2023-12-03 | Disposition: A | Discharge status: Home / Self Care | Payer: BLUE CROSS/BLUE SHIELD | Attending: Physician Assistant | Admitting: Physician Assistant

## 2023-12-03 DIAGNOSIS — R112 Nausea with vomiting, unspecified: Secondary | ICD-10-CM | POA: Insufficient documentation

## 2023-12-03 DIAGNOSIS — B349 Viral infection, unspecified: Secondary | ICD-10-CM | POA: Insufficient documentation

## 2023-12-03 DIAGNOSIS — R197 Diarrhea, unspecified: Secondary | ICD-10-CM | POA: Diagnosis not present

## 2023-12-03 LAB — RESP PANEL BY RT-PCR (FLU A&B, COVID) ARPGX2
Influenza A by PCR: NEGATIVE
Influenza B by PCR: NEGATIVE
SARS Coronavirus 2 by RT PCR: NEGATIVE

## 2023-12-03 MED ORDER — ONDANSETRON 4 MG PO TBDP: 4.0000 mg | ORAL_TABLET | Freq: Three times a day (TID) | ORAL | 0 refills | Status: DC | PRN

## 2023-12-03 NOTE — ED Triage Notes (Addendum)
Sx 1 day  Vomiting and diarrhea bodyaches headache that started today.

## 2023-12-03 NOTE — ED Provider Notes (Signed)
[facility]    CSN: [id number] Arrival date & time: 12/03/23  1910      History   Chief Complaint Chief Complaint  Patient presents with   Emesis   Diarrhea   Headache   Generalized Body Aches    HPI Jaclyn Jackson is a 43 y.o. female presenting with her daughter for onset of abdominal cramping, body aches, headaches, fatigue, nausea/vomiting, and diarrhea today.  Daughter has similar symptoms and also has cough and congestion.  Patient denies fever, cough, congestion, sore throat, chest pain, wheezing, shortness of breath, or weakness.  Patient has not taken any OTC meds today.  HPI  History reviewed. No pertinent past medical history.  There are no active problems to display for this patient.   Past Surgical History:  Procedure Laterality Date   ABDOMINAL SURGERY     CESAREAN SECTION     TUBAL LIGATION      OB History   No obstetric history on file.      Home Medications    Prior to Admission medications   Medication Sig Start Date End Date Taking? Authorizing Provider  gabapentin (NEURONTIN) 600 MG tablet Take by mouth. 03/08/23 12/03/23 Yes [provider]  nortriptyline (PAMELOR) 10 MG capsule Take 20 mg by mouth daily. 06/18/23  Yes [provider]  ondansetron (ZOFRAN-ODT) 4 MG disintegrating tablet Take 1 tablet (4 mg total) by mouth every 8 (eight) hours as needed. 12/03/23  Yes Eusebio Friendly B, PA-C  cyclobenzaprine (FLEXERIL) 10 MG tablet Take 1 tablet (10 mg total) by mouth at bedtime. 03/30/23   Valinda Hoar, NP  gabapentin (NEURONTIN) 300 MG capsule Take 300 mg in the morning and  600 mg at night 03/05/23   [provider]  metoprolol tartrate (LOPRESSOR) 100 MG tablet Take 1 tablet (100 mg total) by mouth once for 1 dose. TWO HOURS PRIOR TO CARDIAC CTA 08/20/23 08/20/23  Debbe Odea, MD  rizatriptan (MAXALT) 10 MG tablet Take 10 mg by mouth as needed.    [provider]    Family  History History reviewed. No pertinent family history.  Social History Social History   Tobacco Use   Smoking status: Never   Smokeless tobacco: Never  Vaping Use   Vaping status: Never Used  Substance Use Topics   Alcohol use: No   Drug use: Never     Allergies   Acetaminophen and Hydrocodone-acetaminophen   Review of Systems Review of Systems  Constitutional:  Positive for appetite change and fatigue. Negative for chills, diaphoresis and fever.  HENT:  Negative for congestion, ear pain, rhinorrhea, sinus pressure, sinus pain and sore throat.   Respiratory:  Negative for cough and shortness of breath.   Gastrointestinal:  Positive for abdominal pain, diarrhea, nausea and vomiting.  Musculoskeletal:  Positive for myalgias.  Skin:  Negative for rash.  Neurological:  Positive for headaches. Negative for weakness.  Hematological:  Negative for adenopathy.     Physical Exam Triage Vital Signs ED Triage Vitals  Encounter Vitals Group     BP 12/03/23 1928 119/75     Systolic BP Percentile --      Diastolic BP Percentile --      Pulse Rate 12/03/23 1928 (!) 107     Resp 12/03/23 1928 19     Temp 12/03/23 1928 99.2 F (37.3 C)     Temp Source 12/03/23 1928 Oral     SpO2 12/03/23 1928 98 %     Weight --  Height --      Head Circumference --      Peak Flow --      Pain Score 12/03/23 1925 10     Pain Loc --      Pain Education --      Exclude from Growth Chart --    No data found.  Updated Vital Signs BP 119/75 (BP Location: Left Arm)   Pulse (!) 107   Temp 99.2 F (37.3 C) (Oral)   Resp 19   LMP 10/26/2023 (Exact Date)   SpO2 98%    Physical Exam Vitals and nursing note reviewed.  Constitutional:      General: She is not in acute distress.    Appearance: Normal appearance. She is not ill-appearing or toxic-appearing.  HENT:     Head: Normocephalic and atraumatic.     Nose: Congestion present.     Mouth/Throat:     Mouth: Mucous membranes are  moist.     Pharynx: Oropharynx is clear.  Eyes:     General: No scleral icterus.       Right eye: No discharge.        Left eye: No discharge.     Conjunctiva/sclera: Conjunctivae normal.  Cardiovascular:     Rate and Rhythm: Regular rhythm. Tachycardia present.     Heart sounds: Normal heart sounds.  Pulmonary:     Effort: Pulmonary effort is normal. No respiratory distress.     Breath sounds: Normal breath sounds.  Abdominal:     Palpations: Abdomen is soft.     Tenderness: There is abdominal tenderness (generalized).  Musculoskeletal:     Cervical back: Neck supple.  Skin:    General: Skin is dry.  Neurological:     General: No focal deficit present.     Mental Status: She is alert. Mental status is at baseline.     Motor: No weakness.     Gait: Gait normal.  Psychiatric:        Mood and Affect: Mood normal.        Behavior: Behavior normal.      UC Treatments / Results  Labs (all labs ordered are listed, but only abnormal results are displayed) Labs Reviewed  RESP PANEL BY RT-PCR (FLU A&B, COVID) [id number]    EKG   Radiology No results found.  Procedures Procedures (including critical care time)  Medications Ordered in UC Medications - No data to display  Initial Impression / Assessment and Plan / UC Course  I have reviewed the triage vital signs and the nursing notes.  Pertinent labs & imaging results that were available during my care of the patient were reviewed by me and considered in my medical decision making (see chart for details).   43 year old female presents for onset of generalized abdominal cramping, nausea/vomiting/diarrhea, body aches and headaches today.  Daughter is sick with similar symptoms.  No meds taken today.  Patient is afebrile.  Overall well-appearing.  No acute distress.  On exam has mild nasal congestion.  Throat clear.  Chest clear to auscultation.  Heart regular rhythm but tachycardic.  Abdomen soft with generalized  tenderness.  Respiratory panel obtained.  Viral illness.  Supportive care encouraged.  Sent Zofran to pharmacy.  Thoroughly reviewed increasing rest and fluids.  Reviewed return and ER precautions.  *** Final Clinical Impressions(s) / UC Diagnoses   Final diagnoses:  Viral illness  Nausea vomiting and diarrhea     Discharge Instructions      -Testing for  COVID and flu.  Will call if any results are positive tomorrow.  ABDOMINAL PAIN: You may take Tylenol for pain relief. Use medications as directed including antiemetics and antidiarrheal medications if suggested or prescribed. You should increase fluids and electrolytes as well as rest over these next several days. If you have any questions or concerns, or if your symptoms are not improving or if especially if they acutely worsen, please call or stop back to the clinic immediately and we will be happy to help you or go to the ER   ABDOMINAL PAIN RED FLAGS: Seek immediate further care if: symptoms remain the same or worsen over the next 3-7 days, you are unable to keep fluids down, you see blood or mucus in your stool, you vomit black or dark red material, you have a fever of 101.F or higher, you have localized and/or persistent abdominal pain      ED Prescriptions     Medication Sig Dispense Auth. Provider   ondansetron (ZOFRAN-ODT) 4 MG disintegrating tablet Take 1 tablet (4 mg total) by mouth every 8 (eight) hours as needed. 15 tablet Gareth Morgan      PDMP not reviewed this encounter.

## 2023-12-03 NOTE — Discharge Instructions (Addendum)
-  Testing for COVID and flu.  Will call if any results are positive tomorrow.  ABDOMINAL PAIN: You may take Tylenol for pain relief. Use medications as directed including antiemetics and antidiarrheal medications if suggested or prescribed. You should increase fluids and electrolytes as well as rest over these next several days. If you have any questions or concerns, or if your symptoms are not improving or if especially if they acutely worsen, please call or stop back to the clinic immediately and we will be happy to help you or go to the ER   ABDOMINAL PAIN RED FLAGS: Seek immediate further care if: symptoms remain the same or worsen over the next 3-7 days, you are unable to keep fluids down, you see blood or mucus in your stool, you vomit black or dark red material, you have a fever of 101.F or higher, you have localized and/or persistent abdominal pain

## 2023-12-17 ENCOUNTER — Other Ambulatory Visit: Payer: Self-pay

## 2023-12-17 DIAGNOSIS — R072 Precordial pain: Secondary | ICD-10-CM

## 2023-12-17 DIAGNOSIS — I2510 Atherosclerotic heart disease of native coronary artery without angina pectoris: Secondary | ICD-10-CM

## 2023-12-19 ENCOUNTER — Other Ambulatory Visit: Payer: Self-pay | Admitting: Physician Assistant

## 2023-12-19 DIAGNOSIS — R519 Headache, unspecified: Secondary | ICD-10-CM

## 2023-12-19 DIAGNOSIS — R200 Anesthesia of skin: Secondary | ICD-10-CM

## 2023-12-20 ENCOUNTER — Other Ambulatory Visit: Payer: Self-pay | Admitting: Cardiology

## 2023-12-20 DIAGNOSIS — R072 Precordial pain: Secondary | ICD-10-CM

## 2023-12-20 DIAGNOSIS — I2510 Atherosclerotic heart disease of native coronary artery without angina pectoris: Secondary | ICD-10-CM

## 2023-12-24 ENCOUNTER — Other Ambulatory Visit: Payer: Self-pay | Admitting: Internal Medicine

## 2023-12-24 DIAGNOSIS — R079 Chest pain, unspecified: Secondary | ICD-10-CM

## 2023-12-31 ENCOUNTER — Ambulatory Visit
Admission: RE | Admit: 2023-12-31 | Discharge: 2023-12-31 | Disposition: A | Discharge status: Home / Self Care | Payer: Self-pay | Source: Ambulatory Visit | Attending: Internal Medicine | Admitting: Internal Medicine

## 2023-12-31 DIAGNOSIS — R079 Chest pain, unspecified: Secondary | ICD-10-CM | POA: Insufficient documentation

## 2024-01-01 ENCOUNTER — Ambulatory Visit
Admission: RE | Admit: 2024-01-01 | Discharge: 2024-01-01 | Disposition: A | Discharge status: Home / Self Care | Payer: BC Managed Care – PPO | Source: Ambulatory Visit | Attending: Physician Assistant | Admitting: Physician Assistant

## 2024-01-01 DIAGNOSIS — R519 Headache, unspecified: Secondary | ICD-10-CM

## 2024-01-01 DIAGNOSIS — R200 Anesthesia of skin: Secondary | ICD-10-CM

## 2024-01-02 ENCOUNTER — Ambulatory Visit: Payer: Self-pay

## 2024-01-09 NOTE — Progress Notes (Unsigned)
  Cancer Center CONSULT NOTE  Patient Care Team: Revelo, Presley Raddle, MD as PCP - General (Family Medicine) Debbe Odea, MD as PCP - Cardiology (Cardiology) Earna Coder, MD as Consulting Physician (Oncology)  CHIEF COMPLAINTS/PURPOSE OF CONSULTATION: ANEMIA   HEMATOLOGY HISTORY  # ANEMIA[Hb; MCV-platelets- WBC; Iron sat; ferritin;  GFR- CT/US- ;   HISTORY OF PRESENTING ILLNESS:  Patient ambulating-independently. Alone.  Jaclyn Jackson 43 y.o.  female pleasant patient is  been referred to [place] for further evaluation of anemia.  Patient complains of shortness of breath with exertion.  Also complains of excessive fatigue.     Blood in stools: none- EGD-9 years [[facility]] /colonoscopy- none  Blood in urine:none- Difficulty swallowing:none- Change of bowel movement/constipation:yes-chronic.  Prior blood transfusion:none- Kidney/Liver disease:none, except for fatty liver.  Alcohol: none- Bariatric surgery: none-  Vaginal bleeding: not heavy.  Prior evaluation with hematology: none- Prior bone marrow biopsy: none- Oral iron: none/ hx of constipation.   Prior IV iron infusions: none-   Review of Systems  Constitutional:  Positive for malaise/fatigue. Negative for chills, diaphoresis, fever and weight loss.  HENT:  Negative for nosebleeds and sore throat.   Eyes:  Negative for double vision.  Respiratory:  Positive for shortness of breath. Negative for cough, hemoptysis, sputum production and wheezing.   Cardiovascular:  Negative for chest pain, palpitations, orthopnea and leg swelling.  Gastrointestinal:  Negative for abdominal pain, blood in stool, constipation, diarrhea, heartburn, melena, nausea and vomiting.  Genitourinary:  Negative for dysuria, frequency and urgency.  Musculoskeletal:  Negative for back pain and joint pain.  Skin: Negative.  Negative for itching and rash.  Neurological:  Negative for dizziness, tingling, focal  weakness, weakness and headaches.  Endo/Heme/Allergies:  Does not bruise/bleed easily.  Psychiatric/Behavioral:  Negative for depression. The patient is not nervous/anxious and does not have insomnia.    MEDICAL HISTORY:  History reviewed. No pertinent past medical history.  SURGICAL HISTORY: Past Surgical History:  Procedure Laterality Date   ABDOMINAL SURGERY     CESAREAN SECTION     TUBAL LIGATION      SOCIAL HISTORY: Social History   Socioeconomic History   Marital status: Married    Spouse name: Not on file   Number of children: Not on file   Years of education: Not on file   Highest education level: Not on file  Occupational History   Not on file  Tobacco Use   Smoking status: Never   Smokeless tobacco: Never  Vaping Use   Vaping status: Never Used  Substance and Sexual Activity   Alcohol use: No   Drug use: Never   Sexual activity: Not on file  Other Topics Concern   Not on file  Social History Narrative   Family: mat great aunt- colon cancer in 30 ys.. [place] No smoking or alcohol. [employer] aware house.    Social Drivers of Corporate investment banker Strain: Not on file  Food Insecurity: Food Insecurity Present (01/10/2024)   Hunger Vital Sign    Worried About Running Out of Food in the Last Year: Never true    Ran Out of Food in the Last Year: Sometimes true  Transportation Needs: No Transportation Needs (01/10/2024)   PRAPARE - Administrator, Civil Service (Medical): No    Lack of Transportation (Non-Medical): No  Physical Activity: Not on file  Stress: Not on file  Social Connections: Not on file  Intimate Partner Violence: Not At  Risk (01/10/2024)   Humiliation, Afraid, Rape, and Kick questionnaire    Fear of Current or Ex-Partner: No    Emotionally Abused: No    Physically Abused: No    Sexually Abused: No    FAMILY HISTORY: Family History  Problem Relation Age of Onset   Hypertension Mother    Heart disease Father    Colon  cancer Maternal Aunt    Diabetes Maternal Uncle    Diabetes Maternal Grandmother     ALLERGIES:  is allergic to acetaminophen and hydrocodone-acetaminophen.  MEDICATIONS:  Current Outpatient Medications  Medication Sig Dispense Refill   cyclobenzaprine (FLEXERIL) 10 MG tablet Take 1 tablet (10 mg total) by mouth at bedtime. 10 tablet 0   gabapentin (NEURONTIN) 300 MG capsule Take 300 mg in the morning and  600 mg at night     nortriptyline (PAMELOR) 10 MG capsule Take 20 mg by mouth daily. Take 20 mg qam and 50 mg at bedtime     Vitamin D, Ergocalciferol, (DRISDOL) 1.25 MG (50000 UNIT) CAPS capsule Take 50,000 Units by mouth every 7 (seven) days.     gabapentin (NEURONTIN) 600 MG tablet Take by mouth.     No current facility-administered medications for this visit.     PHYSICAL EXAMINATION:   Vitals:   01/10/24 1037  BP: 123/82  Pulse: 93  Resp: 16  Temp: 97.9 F (36.6 C)  SpO2: 100%   Filed Weights   01/10/24 1037  Weight: 160 lb 6.4 oz (72.8 kg)    Physical Exam Vitals and nursing note reviewed.  HENT:     Head: Normocephalic and atraumatic.     Mouth/Throat:     Pharynx: Oropharynx is clear.  Eyes:     Extraocular Movements: Extraocular movements intact.     Pupils: Pupils are equal, round, and reactive to light.  Cardiovascular:     Rate and Rhythm: Normal rate and regular rhythm.  Pulmonary:     Comments: Decreased breath sounds bilaterally.  Abdominal:     Palpations: Abdomen is soft.  Musculoskeletal:        General: Normal range of motion.     Cervical back: Normal range of motion.  Skin:    General: Skin is warm.  Neurological:     General: No focal deficit present.     Mental Status: She is alert and oriented to person, place, and time.  Psychiatric:        Behavior: Behavior normal.        Judgment: Judgment normal.      LABORATORY DATA:  I have reviewed the data as listed Lab Results  Component Value Date   WBC 6.8 05/17/2023   HGB  10.5 (L) 05/17/2023   HCT 32.7 (L) 05/17/2023   MCV 74.0 (L) 05/17/2023   PLT 241 05/17/2023   Recent Labs    05/17/23 1934 08/20/23 1247  NA 136 139  K 3.0* 4.4  CL 106 103  CO2 19* 23  GLUCOSE 95 79  BUN 20 11  CREATININE 1.17* 0.92  CALCIUM 9.4 10.0  GFRNONAA >60  --      MR BRAIN WO CONTRAST Result Date: 01/01/2024 CLINICAL DATA:  Provided history: Headache disorder. Numbness and tingling. Additional history provided by the scanning technologist: The patient reports migraine headaches, numbness/tingling in shoulders and arms. EXAM: MRI HEAD WITHOUT CONTRAST TECHNIQUE: Multiplanar, multiecho pulse sequences of the brain and surrounding structures were obtained without intravenous contrast. COMPARISON:  Brain MRI 08/20/2020. FINDINGS: Brain: No age-advanced  or lobar predominant cerebral atrophy. There are a few small nonspecific foci of T2 FLAIR hyperintense signal abnormality scattered within the bilateral cerebral white matter (for instance as seen on series 112, image 26). 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 No cortical encephalomalacia is identified. There is no acute infarct. No evidence of an intracranial mass. No chronic intracranial blood products. No extra-axial fluid collection. No midline shift. Vascular: Maintained flow voids within the proximal large arterial vessels. Skull and upper cervical spine: Abnormal T1 hypointense marrow signal within visible portions of the cervical spine. Sinuses/Orbits: No mass or acute finding within the imaged orbits. Small mucous retention cyst within the right maxillary sinus. 11 mm mucous retention cyst within the left maxillary sinus. IMPRESSION: 1.  No evidence of an acute intracranial abnormality. 2. There are a few small nonspecific chronic insults within the cerebral white matter, new from the prior brain MRI of 08/20/2020. 3. Unchanged 5 mm pineal cyst. 4. Otherwise unremarkable non-contrast MRI  appearance the brain. 5. Abnormal T1 hypointense marrow signal within visible portions of the cervical spine. While this finding can reflect a marrow infiltrative process, the most common causes include chronic anemia, smoking and obesity. 6. Paranasal sinus disease as described. Electronically Signed   By: Jackey Loge D.O.   On: 01/01/2024 09:31   CT CARDIAC SCORING (SELF PAY ONLY) Result Date: 12/31/2023 CLINICAL DATA:  Risk stratification EXAM: Coronary Calcium Score TECHNIQUE: The patient was scanned on a Siemens Somatom scanner. Axial non-contrast 3 mm slices were carried out through the heart. The data set was analyzed on a dedicated work station and scored using the Agatson method. FINDINGS: Non-cardiac: See separate report from [facility]. Ascending Aorta: Normal size Pericardium: Normal Coronary arteries: Normal origin of left and right coronary arteries. Distribution of arterial calcifications if present, as noted below; LM 0 LAD 0 LCx 0 RCA 0 Total 0 IMPRESSION AND RECOMMENDATION: 1. Coronary calcium score of 0. 2. CAC 0, CAC-DRS A0. 3. Continue heart healthy lifestyle and risk factor modification. Electronically Signed   By: Debbe Odea M.D.   On: 12/31/2023 09:20    ASSESSMENT & PLAN:   Symptomatic anemia # Anemia- Hb-symptomatic.  Likely due to iron deficiency - from etiology GI blood loss/menorrhagia/malabsorption.  Poor tolerance/lack of improvement on oral iron.  Discussed regarding IV iron infusion/Venofer. Discussed the potential acute infusion reactions with IV iron; which are quite rare.  Patient understands the risk; will proceed with infusions.   # Recommend CBC CMP LDH peripheral smear; haptoglobin; iron studies ferritin B12 folic acid; Urine analysis.  Urine pregnancy test.   #Etiology of iron deficiency: possible menstrual blood; loss- Hx  EGD-9 years [[facility]] /colonoscopy- none  I had a long discussion with the patient regarding multiple etiologies of  anemia including iron deficiency-which is mainly caused by blood loss/malabsorption.  Recommend GI evaluation-EGD colonoscopy. HOLD off  capsule study/ CT scan abdomen pelvis for now.  Review is reasonable  # Left hand tenditis- recommend topical Voltaren gel. If not  improved fer to PCP.   Thank you Ms. Marlene Bast, PA-C for allowing me to participate in the care of your pleasant patient. Please do not hesitate to contact me with questions or concerns in the interim.   # DISPOSITION: # labs today- ordered.  # weekly venofer x3  # referral to GI- [facility] re: anemia/Hx of reflux # follow up  2 months-  MD; labs- cbc/bmp; urine pregnancy test; possible venofer-  Dr.B  All questions were answered. The patient knows to call the clinic with any problems, questions or concerns.   Earna Coder, MD 01/10/2024 11:54 AM

## 2024-01-10 ENCOUNTER — Encounter: Payer: Self-pay | Admitting: Internal Medicine

## 2024-01-10 ENCOUNTER — Inpatient Hospital Stay

## 2024-01-10 ENCOUNTER — Inpatient Hospital Stay: Attending: Internal Medicine | Admitting: Internal Medicine

## 2024-01-10 VITALS — BP 123/82 | HR 93 | Temp 97.9°F | Resp 16 | Ht 62.0 in | Wt 160.4 lb

## 2024-01-10 DIAGNOSIS — N939 Abnormal uterine and vaginal bleeding, unspecified: Secondary | ICD-10-CM | POA: Insufficient documentation

## 2024-01-10 DIAGNOSIS — Z800 Family history of malignant neoplasm of digestive organs: Secondary | ICD-10-CM | POA: Insufficient documentation

## 2024-01-10 DIAGNOSIS — K5900 Constipation, unspecified: Secondary | ICD-10-CM | POA: Diagnosis not present

## 2024-01-10 DIAGNOSIS — D649 Anemia, unspecified: Secondary | ICD-10-CM

## 2024-01-10 DIAGNOSIS — E611 Iron deficiency: Secondary | ICD-10-CM | POA: Insufficient documentation

## 2024-01-10 DIAGNOSIS — K760 Fatty (change of) liver, not elsewhere classified: Secondary | ICD-10-CM | POA: Insufficient documentation

## 2024-01-10 DIAGNOSIS — R0602 Shortness of breath: Secondary | ICD-10-CM | POA: Diagnosis not present

## 2024-01-10 LAB — CBC WITH DIFFERENTIAL/PLATELET
Abs Immature Granulocytes: 0.01 10*3/uL (ref 0.00–0.07)
Basophils Absolute: 0 10*3/uL (ref 0.0–0.1)
Basophils Relative: 1 %
Eosinophils Absolute: 0.1 10*3/uL (ref 0.0–0.5)
Eosinophils Relative: 2 %
HCT: 31.4 % — ABNORMAL LOW (ref 36.0–46.0)
Hemoglobin: 9.5 g/dL — ABNORMAL LOW (ref 12.0–15.0)
Immature Granulocytes: 0 %
Lymphocytes Relative: 19 %
Lymphs Abs: 0.8 10*3/uL (ref 0.7–4.0)
MCH: 21.3 pg — ABNORMAL LOW (ref 26.0–34.0)
MCHC: 30.3 g/dL (ref 30.0–36.0)
MCV: 70.6 fL — ABNORMAL LOW (ref 80.0–100.0)
Monocytes Absolute: 0.4 10*3/uL (ref 0.1–1.0)
Monocytes Relative: 9 %
Neutro Abs: 2.9 10*3/uL (ref 1.7–7.7)
Neutrophils Relative %: 69 %
Platelets: 244 10*3/uL (ref 150–400)
RBC: 4.45 MIL/uL (ref 3.87–5.11)
RDW: 17.7 % — ABNORMAL HIGH (ref 11.5–15.5)
WBC: 4.2 10*3/uL (ref 4.0–10.5)
nRBC: 0 % (ref 0.0–0.2)

## 2024-01-10 LAB — URINALYSIS, COMPLETE (UACMP) WITH MICROSCOPIC
Bilirubin Urine: NEGATIVE
Glucose, UA: NEGATIVE mg/dL
Hgb urine dipstick: NEGATIVE
Ketones, ur: NEGATIVE mg/dL
Nitrite: NEGATIVE
Protein, ur: 30 mg/dL — AB
Specific Gravity, Urine: 1.029 (ref 1.005–1.030)
pH: 5 (ref 5.0–8.0)

## 2024-01-10 LAB — IRON AND TIBC
Iron: 31 ug/dL (ref 28–170)
Saturation Ratios: 7 % — ABNORMAL LOW (ref 10.4–31.8)
TIBC: 433 ug/dL (ref 250–450)
UIBC: 402 ug/dL

## 2024-01-10 LAB — VITAMIN B12: Vitamin B-12: 236 pg/mL (ref 180–914)

## 2024-01-10 LAB — LACTATE DEHYDROGENASE: LDH: 163 U/L (ref 98–192)

## 2024-01-10 LAB — COMPREHENSIVE METABOLIC PANEL
ALT: 23 U/L (ref 0–44)
AST: 27 U/L (ref 15–41)
Albumin: 4.1 g/dL (ref 3.5–5.0)
Alkaline Phosphatase: 60 U/L (ref 38–126)
Anion gap: 8 (ref 5–15)
BUN: 19 mg/dL (ref 6–20)
CO2: 25 mmol/L (ref 22–32)
Calcium: 9.1 mg/dL (ref 8.9–10.3)
Chloride: 105 mmol/L (ref 98–111)
Creatinine, Ser: 0.84 mg/dL (ref 0.44–1.00)
GFR, Estimated: >60 mL/min (ref >60)
Glucose, Bld: 99 mg/dL (ref 70–99)
Potassium: 3.6 mmol/L (ref 3.5–5.1)
Sodium: 138 mmol/L (ref 135–145)
Total Bilirubin: 0.5 mg/dL (ref 0.0–1.2)
Total Protein: 7.6 g/dL (ref 6.5–8.1)

## 2024-01-10 LAB — PREGNANCY, URINE: Preg Test, Ur: NEGATIVE

## 2024-01-10 LAB — FERRITIN: Ferritin: 5 ng/mL — ABNORMAL LOW (ref 11–307)

## 2024-01-10 NOTE — Progress Notes (Signed)
 Fatigue/weakness: YES Dyspena: YES Light headedness: NO Blood in stool: NO

## 2024-01-10 NOTE — Assessment & Plan Note (Addendum)
#   Anemia- Hb-symptomatic.  Likely due to iron deficiency - from etiology GI blood loss/menorrhagia/malabsorption.  Poor tolerance/lack of improvement on oral iron.  Discussed regarding IV iron infusion/Venofer. Discussed the potential acute infusion reactions with IV iron; which are quite rare.  Patient understands the risk; will proceed with infusions.   # Recommend CBC CMP LDH peripheral smear; haptoglobin; iron studies ferritin B12 folic acid; Urine analysis.  Urine pregnancy test.   #Etiology of iron deficiency: possible menstrual blood; loss- Hx  EGD-9 years [[facility]] /colonoscopy- none  I had a long discussion with the patient regarding multiple etiologies of anemia including iron deficiency-which is mainly caused by blood loss/malabsorption.  Recommend GI evaluation-EGD colonoscopy. HOLD off  capsule study/ CT scan abdomen pelvis for now.  Review is reasonable  # Left hand tenditis- recommend topical Voltaren gel. If not  improved fer to PCP.   Thank you Ms. Marlene Bast, PA-C for allowing me to participate in the care of your pleasant patient. Please do not hesitate to contact me with questions or concerns in the interim.   # DISPOSITION: # labs today- ordered.  # weekly venofer x3  # referral to GI- [facility] re: anemia/Hx of reflux # follow up  2 months-  MD; labs- cbc/bmp; urine pregnancy test; possible venofer- Dr.B

## 2024-01-21 ENCOUNTER — Inpatient Hospital Stay

## 2024-01-21 ENCOUNTER — Encounter: Payer: Self-pay | Admitting: Internal Medicine

## 2024-01-21 VITALS — BP 137/78 | HR 91 | Temp 97.5°F | Resp 16

## 2024-01-21 DIAGNOSIS — D649 Anemia, unspecified: Secondary | ICD-10-CM | POA: Diagnosis not present

## 2024-01-21 MED ORDER — IRON SUCROSE 20 MG/ML IV SOLN
200.0000 mg | Freq: Once | INTRAVENOUS | Status: AC
  Administered 2024-01-21: 200 mg via INTRAVENOUS
  Filled 2024-01-21: qty 10

## 2024-01-29 ENCOUNTER — Inpatient Hospital Stay

## 2024-01-29 VITALS — BP 115/74 | HR 88 | Temp 98.0°F | Resp 16

## 2024-01-29 DIAGNOSIS — D649 Anemia, unspecified: Secondary | ICD-10-CM

## 2024-01-29 MED ORDER — IRON SUCROSE 20 MG/ML IV SOLN
200.0000 mg | Freq: Once | INTRAVENOUS | Status: AC
  Administered 2024-01-29: 200 mg via INTRAVENOUS
  Filled 2024-01-29: qty 10

## 2024-01-31 ENCOUNTER — Ambulatory Visit: Payer: BLUE CROSS/BLUE SHIELD | Admitting: Cardiology

## 2024-02-05 ENCOUNTER — Inpatient Hospital Stay: Attending: Internal Medicine

## 2024-02-05 VITALS — BP 114/76 | HR 72 | Temp 98.3°F | Resp 16

## 2024-02-05 DIAGNOSIS — E611 Iron deficiency: Secondary | ICD-10-CM | POA: Insufficient documentation

## 2024-02-05 DIAGNOSIS — D649 Anemia, unspecified: Secondary | ICD-10-CM | POA: Insufficient documentation

## 2024-02-05 MED ORDER — SODIUM CHLORIDE 0.9% FLUSH
10.0000 mL | Freq: Once | INTRAVENOUS | Status: AC | PRN
  Administered 2024-02-05: 10 mL
  Filled 2024-02-05: qty 10

## 2024-02-05 MED ORDER — IRON SUCROSE 20 MG/ML IV SOLN
200.0000 mg | Freq: Once | INTRAVENOUS | Status: AC
  Administered 2024-02-05: 200 mg via INTRAVENOUS
  Filled 2024-02-05: qty 10

## 2024-02-21 ENCOUNTER — Encounter: Payer: Self-pay | Admitting: Internal Medicine

## 2024-03-06 ENCOUNTER — Encounter: Payer: Self-pay | Admitting: Gastroenterology

## 2024-03-12 ENCOUNTER — Inpatient Hospital Stay: Attending: Internal Medicine

## 2024-03-12 ENCOUNTER — Encounter: Payer: Self-pay | Admitting: *Deleted

## 2024-03-12 ENCOUNTER — Encounter: Payer: Self-pay | Admitting: Internal Medicine

## 2024-03-12 ENCOUNTER — Inpatient Hospital Stay (HOSPITAL_BASED_OUTPATIENT_CLINIC_OR_DEPARTMENT_OTHER): Admitting: Internal Medicine

## 2024-03-12 ENCOUNTER — Inpatient Hospital Stay

## 2024-03-12 VITALS — BP 111/86 | HR 69 | Temp 97.4°F | Resp 12 | Ht 62.0 in | Wt 158.5 lb

## 2024-03-12 VITALS — BP 109/80 | HR 80 | Temp 97.6°F | Resp 16

## 2024-03-12 DIAGNOSIS — Z800 Family history of malignant neoplasm of digestive organs: Secondary | ICD-10-CM | POA: Insufficient documentation

## 2024-03-12 DIAGNOSIS — K5909 Other constipation: Secondary | ICD-10-CM | POA: Diagnosis not present

## 2024-03-12 DIAGNOSIS — D649 Anemia, unspecified: Secondary | ICD-10-CM | POA: Diagnosis not present

## 2024-03-12 DIAGNOSIS — D509 Iron deficiency anemia, unspecified: Secondary | ICD-10-CM | POA: Diagnosis not present

## 2024-03-12 DIAGNOSIS — N920 Excessive and frequent menstruation with regular cycle: Secondary | ICD-10-CM | POA: Diagnosis not present

## 2024-03-12 LAB — CBC WITH DIFFERENTIAL (CANCER CENTER ONLY)
Abs Immature Granulocytes: 0.02 10*3/uL (ref 0.00–0.07)
Basophils Absolute: 0 10*3/uL (ref 0.0–0.1)
Basophils Relative: 1 %
Eosinophils Absolute: 0.1 10*3/uL (ref 0.0–0.5)
Eosinophils Relative: 2 %
HCT: 36.6 % (ref 36.0–46.0)
Hemoglobin: 11.6 g/dL — ABNORMAL LOW (ref 12.0–15.0)
Immature Granulocytes: 0 %
Lymphocytes Relative: 21 %
Lymphs Abs: 1.2 10*3/uL (ref 0.7–4.0)
MCH: 24.7 pg — ABNORMAL LOW (ref 26.0–34.0)
MCHC: 31.7 g/dL (ref 30.0–36.0)
MCV: 78 fL — ABNORMAL LOW (ref 80.0–100.0)
Monocytes Absolute: 0.6 10*3/uL (ref 0.1–1.0)
Monocytes Relative: 10 %
Neutro Abs: 4 10*3/uL (ref 1.7–7.7)
Neutrophils Relative %: 66 %
Platelet Count: 250 10*3/uL (ref 150–400)
RBC: 4.69 MIL/uL (ref 3.87–5.11)
RDW: 21.7 % — ABNORMAL HIGH (ref 11.5–15.5)
WBC Count: 5.9 10*3/uL (ref 4.0–10.5)
nRBC: 0 % (ref 0.0–0.2)

## 2024-03-12 LAB — BASIC METABOLIC PANEL WITH GFR
Anion gap: 8 (ref 5–15)
BUN: 19 mg/dL (ref 6–20)
CO2: 25 mmol/L (ref 22–32)
Calcium: 8.8 mg/dL — ABNORMAL LOW (ref 8.9–10.3)
Chloride: 103 mmol/L (ref 98–111)
Creatinine, Ser: 0.89 mg/dL (ref 0.44–1.00)
GFR, Estimated: >60 mL/min (ref >60)
Glucose, Bld: 94 mg/dL (ref 70–99)
Potassium: 3.9 mmol/L (ref 3.5–5.1)
Sodium: 136 mmol/L (ref 135–145)

## 2024-03-12 LAB — PREGNANCY, URINE: Preg Test, Ur: NEGATIVE

## 2024-03-12 MED ORDER — IRON SUCROSE 20 MG/ML IV SOLN
200.0000 mg | Freq: Once | INTRAVENOUS | Status: AC
  Administered 2024-03-12: 200 mg via INTRAVENOUS

## 2024-03-12 MED ORDER — SODIUM CHLORIDE 0.9% FLUSH
10.0000 mL | Freq: Once | INTRAVENOUS | Status: AC | PRN
  Administered 2024-03-12: 10 mL
  Filled 2024-03-12: qty 10

## 2024-03-12 NOTE — Progress Notes (Signed)
 Fatigue/weakness: NO/FEELS SLEEPY Dyspena: NO  Light headedness: NO Blood in stool: NO  Has appt for colonoscopy tomorrow at [facility].

## 2024-03-12 NOTE — Patient Instructions (Signed)

## 2024-03-12 NOTE — Progress Notes (Signed)
  [facility] CONSULT NOTE  Patient Care Team: Revelo, Alyn Babe, MD as PCP - General (Family Medicine) Constancia Delton, MD as PCP - Cardiology (Cardiology) Gwyn Leos, MD as Consulting Physician (Oncology)  CHIEF COMPLAINTS/PURPOSE OF CONSULTATION: ANEMIA   HEMATOLOGY HISTORY  # ANEMIA[Hb; MCV-platelets- WBC; Iron  sat; ferritin;  GFR- CT/US - ;   HISTORY OF PRESENTING ILLNESS:  Patient ambulating-independently. Alone.  Jaclyn Jackson 43 y.o.  female pleasant patient iron  deficiency anemia here for follow-up.  S/p venofer  infusion. Improved.  S/p evaluation with GI. Jaclyn Jackson  Patient admit to improvement of shortness of breath with exertion, and excessive fatigue.    Review of Systems  Constitutional:  Positive for malaise/fatigue. Negative for chills, diaphoresis, fever and weight loss.  HENT:  Negative for nosebleeds and sore throat.   Eyes:  Negative for double vision.  Respiratory:  Negative for cough, hemoptysis, sputum production and wheezing.   Cardiovascular:  Negative for chest pain, palpitations, orthopnea and leg swelling.  Gastrointestinal:  Negative for abdominal pain, blood in stool, constipation, diarrhea, heartburn, melena, nausea and vomiting.  Genitourinary:  Negative for dysuria, frequency and urgency.  Musculoskeletal:  Negative for back pain and joint pain.  Skin: Negative.  Negative for itching and rash.  Neurological:  Negative for dizziness, tingling, focal weakness, weakness and headaches.  Endo/Heme/Allergies:  Does not bruise/bleed easily.  Psychiatric/Behavioral:  Negative for depression. The patient is not nervous/anxious and does not have insomnia.    MEDICAL HISTORY:  Past Medical History:  Diagnosis Date   Headache    Photophobia     SURGICAL HISTORY: Past Surgical History:  Procedure Laterality Date   ABDOMINAL SURGERY     CESAREAN SECTION     TUBAL LIGATION      SOCIAL HISTORY: Social History    Socioeconomic History   Marital status: Married    Spouse name: Not on file   Number of children: Not on file   Years of education: Not on file   Highest education level: Not on file  Occupational History   Not on file  Tobacco Use   Smoking status: Never   Smokeless tobacco: Never  Vaping Use   Vaping status: Never Used  Substance and Sexual Activity   Alcohol use: No   Drug use: Never   Sexual activity: Not on file  Other Topics Concern   Not on file  Social History Narrative   Family: mat great aunt- colon cancer in 67 ys.. [place] No smoking or alcohol. [employer] aware house.    Social Drivers of Corporate investment banker Strain: Low Risk  (02/13/2024)   Received from [facility]   Overall Financial Resource Strain (CARDIA)    Difficulty of Paying Living Expenses: Not hard at all  Food Insecurity: No Food Insecurity (02/13/2024)   Received from [facility]   Hunger Vital Sign    Worried About Running Out of Food in the Last Year: Never true    Ran Out of Food in the Last Year: Never true  Recent Concern: Food Insecurity - Food Insecurity Present (01/10/2024)   Hunger Vital Sign    Worried About Running Out of Food in the Last Year: Never true    Ran Out of Food in the Last Year: Sometimes true  Transportation Needs: No Transportation Needs (02/13/2024)   Received from [facility] - Transportation    In the past 12 months, has lack of transportation  kept you from medical appointments or from getting medications?: No    Lack of Transportation (Non-Medical): No  Physical Activity: Not on file  Stress: Not on file  Social Connections: Not on file  Intimate Partner Violence: Not At Risk (01/10/2024)   Humiliation, Afraid, Rape, and Kick questionnaire    Fear of Current or Ex-Partner: No    Emotionally Abused: No    Physically Abused: No    Sexually Abused: No    FAMILY HISTORY: Family History  Problem  Relation Age of Onset   Hypertension Mother    Heart disease Father    Colon cancer Maternal Aunt    Diabetes Maternal Uncle    Diabetes Maternal Grandmother     ALLERGIES:  is allergic to acetaminophen  and hydrocodone -acetaminophen .  MEDICATIONS:  Current Outpatient Medications  Medication Sig Dispense Refill   cyclobenzaprine  (FLEXERIL ) 10 MG tablet Take 1 tablet (10 mg total) by mouth at bedtime. 10 tablet 0   gabapentin (NEURONTIN) 300 MG capsule Take 300 mg in the morning and  600 mg at night     linaclotide (LINZESS) 145 MCG CAPS capsule Take 145 mcg by mouth daily before breakfast.     nortriptyline (PAMELOR) 10 MG capsule Take 20 mg by mouth daily. Take 20 mg qam and 50 mg at bedtime     Vitamin D, Ergocalciferol, (DRISDOL) 1.25 MG (50000 UNIT) CAPS capsule Take 50,000 Units by mouth every 7 (seven) days.     gabapentin (NEURONTIN) 600 MG tablet Take by mouth.     No current facility-administered medications for this visit.     PHYSICAL EXAMINATION:   Vitals:   03/12/24 0907  BP: 111/86  Pulse: 69  Resp: 12  Temp: (!) 97.4 F (36.3 C)  SpO2: 100%   Filed Weights   03/12/24 0907  Weight: 158 lb 8 oz (71.9 kg)    Physical Exam Vitals and nursing note reviewed.  HENT:     Head: Normocephalic and atraumatic.     Mouth/Throat:     Pharynx: Oropharynx is clear.  Eyes:     Extraocular Movements: Extraocular movements intact.     Pupils: Pupils are equal, round, and reactive to light.  Cardiovascular:     Rate and Rhythm: Normal rate and regular rhythm.  Pulmonary:     Comments: Decreased breath sounds bilaterally.  Abdominal:     Palpations: Abdomen is soft.  Musculoskeletal:        General: Normal range of motion.     Cervical back: Normal range of motion.  Skin:    General: Skin is warm.  Neurological:     General: No focal deficit present.     Mental Status: She is alert and oriented to person, place, and time.  Psychiatric:        Behavior:  Behavior normal.        Judgment: Judgment normal.      LABORATORY DATA:  I have reviewed the data as listed Lab Results  Component Value Date   WBC 5.9 03/12/2024   HGB 11.6 (L) 03/12/2024   HCT 36.6 03/12/2024   MCV 78.0 (L) 03/12/2024   PLT 250 03/12/2024   Recent Labs    05/17/23 1934 08/20/23 1247 01/10/24 1158 03/12/24 0917  NA 136 139 138 136  K 3.0* 4.4 3.6 3.9  CL 106 103 105 103  CO2 19* 23 25 25   GLUCOSE 95 79 99 94  BUN 20 11 19 19   CREATININE 1.17* 0.92 0.84 0.89  CALCIUM 9.4 10.0 9.1 8.8*  GFRNONAA >60  --  >60 >60  PROT  --   --  7.6  --   ALBUMIN  --   --  4.1  --   AST  --   --  27  --   ALT  --   --  23  --   ALKPHOS  --   --  60  --   BILITOT  --   --  0.5  --      No results found.   ASSESSMENT & PLAN:   Symptomatic anemia # Anemia- Hb-symptomatic. From  iron  deficiency [march 2025- ferritin-5] - from etiology ?GI blood loss/menorrhagia.   S/p  IV iron  infusion/Venofer - proceed with venofer .   #Etiology of iron  deficiency: possible menstrual blood; loss- Hx  EGD-9 years [[facility]] /colonoscopy- s/p GI evaluation-  colonoscopy- 5/08-. HOLD off  capsule study/ CT scan abdomen pelvis for now.     #Chronic constipation on linzess    # DISPOSITION: # venofer  today # follow up  4 months-  MD; labs- cbc/bmp;iron  studies; ferritin;  urine pregnancy test; possible venofer - Dr.B  All questions were answered. The patient knows to call the clinic with any problems, questions or concerns.   Gwyn Leos, MD 03/12/2024 10:29 AM

## 2024-03-12 NOTE — Assessment & Plan Note (Signed)
#   Anemia- Hb-symptomatic. From  iron  deficiency [march 2025- ferritin-5] - from etiology ?GI blood loss/menorrhagia.   S/p  IV iron  infusion/Venofer - proceed with venofer .   #Etiology of iron  deficiency: possible menstrual blood; loss- Hx  EGD-9 years [[facility]] /colonoscopy- s/p GI evaluation-  colonoscopy- 5/08-. HOLD off  capsule study/ CT scan abdomen pelvis for now.     #Chronic constipation on linzess    # DISPOSITION: # venofer  today # follow up  4 months-  MD; labs- cbc/bmp;iron  studies; ferritin;  urine pregnancy test; possible venofer - Dr.B

## 2024-03-13 ENCOUNTER — Encounter
Admission: RE | Disposition: A | Discharge status: Home / Self Care | Payer: Self-pay | Source: Home / Self Care | Attending: Gastroenterology

## 2024-03-13 ENCOUNTER — Ambulatory Visit
Admission: RE | Admit: 2024-03-13 | Discharge: 2024-03-13 | Disposition: A | Discharge status: Home / Self Care | Attending: Gastroenterology | Admitting: Gastroenterology

## 2024-03-13 ENCOUNTER — Ambulatory Visit: Admitting: Certified Registered"

## 2024-03-13 ENCOUNTER — Encounter: Payer: Self-pay | Admitting: Gastroenterology

## 2024-03-13 DIAGNOSIS — R1310 Dysphagia, unspecified: Secondary | ICD-10-CM | POA: Diagnosis not present

## 2024-03-13 DIAGNOSIS — Z5309 Procedure and treatment not carried out because of other contraindication: Secondary | ICD-10-CM | POA: Insufficient documentation

## 2024-03-13 DIAGNOSIS — D649 Anemia, unspecified: Secondary | ICD-10-CM | POA: Insufficient documentation

## 2024-03-13 DIAGNOSIS — B9681 Helicobacter pylori [H. pylori] as the cause of diseases classified elsewhere: Secondary | ICD-10-CM | POA: Diagnosis not present

## 2024-03-13 DIAGNOSIS — K5900 Constipation, unspecified: Secondary | ICD-10-CM | POA: Insufficient documentation

## 2024-03-13 DIAGNOSIS — Z79899 Other long term (current) drug therapy: Secondary | ICD-10-CM | POA: Diagnosis not present

## 2024-03-13 DIAGNOSIS — R519 Headache, unspecified: Secondary | ICD-10-CM | POA: Insufficient documentation

## 2024-03-13 DIAGNOSIS — D509 Iron deficiency anemia, unspecified: Secondary | ICD-10-CM | POA: Insufficient documentation

## 2024-03-13 DIAGNOSIS — K219 Gastro-esophageal reflux disease without esophagitis: Secondary | ICD-10-CM | POA: Diagnosis not present

## 2024-03-13 DIAGNOSIS — K2950 Unspecified chronic gastritis without bleeding: Secondary | ICD-10-CM | POA: Diagnosis not present

## 2024-03-13 HISTORY — PX: COLONOSCOPY: SHX5424

## 2024-03-13 HISTORY — DX: Headache, unspecified: R51.9

## 2024-03-13 HISTORY — PX: ESOPHAGOGASTRODUODENOSCOPY: SHX5428

## 2024-03-13 HISTORY — DX: Visual discomfort, unspecified: H53.149

## 2024-03-13 LAB — POCT PREGNANCY, URINE: Preg Test, Ur: NEGATIVE

## 2024-03-13 SURGERY — COLONOSCOPY
Anesthesia: General

## 2024-03-13 MED ORDER — SODIUM CHLORIDE 0.9 % IV SOLN: INTRAVENOUS | Status: DC

## 2024-03-13 MED ORDER — GLYCOPYRROLATE 0.2 MG/ML IJ SOLN
INTRAMUSCULAR | Status: DC | PRN
  Administered 2024-03-13: .2 mg via INTRAVENOUS

## 2024-03-13 MED ORDER — MIDAZOLAM HCL 2 MG/2ML IJ SOLN
INTRAMUSCULAR | Status: AC
  Filled 2024-03-13: qty 2

## 2024-03-13 MED ORDER — MIDAZOLAM HCL 2 MG/2ML IJ SOLN
INTRAMUSCULAR | Status: DC | PRN
  Administered 2024-03-13: 2 mg via INTRAVENOUS

## 2024-03-13 MED ORDER — DEXMEDETOMIDINE HCL IN NACL 200 MCG/50ML IV SOLN
INTRAVENOUS | Status: DC | PRN
  Administered 2024-03-13: 12 ug via INTRAVENOUS

## 2024-03-13 MED ORDER — PROPOFOL 10 MG/ML IV BOLUS
INTRAVENOUS | Status: DC | PRN
  Administered 2024-03-13: 70 mg via INTRAVENOUS
  Administered 2024-03-13 (×2): 30 mg via INTRAVENOUS

## 2024-03-13 MED ORDER — PROPOFOL 500 MG/50ML IV EMUL
INTRAVENOUS | Status: DC | PRN
Start: 2024-03-13 — End: 2024-03-13
  Administered 2024-03-13: 145 ug/kg/min via INTRAVENOUS

## 2024-03-13 NOTE — H&P (Signed)
 Pre-Procedure H&P   Patient ID: Jaclyn Jackson is a 43 y.o. female.  Gastroenterology Provider: [name], DO  Referring Provider: Jamee Mazzoni, PA-C PCP: Kenton Pearl, MD  Date: 03/13/2024  HPI Ms. Jaclyn Jackson is a 43 y.o. female who presents today for Esophagogastroduodenoscopy and Colonoscopy for Iron  deficiency anemia, GERD, pill dysphagia, constipation .  Information garnered via chart review and video medical Spanish interpreter Jaclyn Jackson [phone]).  Pt deals with constipation regularly going 5-7 days w/o a bowel movement. No melena/hematochezia. No previous colonoscopy.  Hgb baseline since 2016 10-11. Recently 9.7 mcv 71 ferritin 5 tibc 433 sat 7%.  Reprots gerd with dysphagia. Suprasternal sticking of pills and liquids. No issues with solids. No odynophagia. Underwent egd in [place]  9 years ago which was normal per patient report  Aunt with h/o crc   Past Medical History:  Diagnosis Date   Headache    Photophobia     Past Surgical History:  Procedure Laterality Date   ABDOMINAL SURGERY     CESAREAN SECTION     TUBAL LIGATION      Family History Aunt- crc No h/o GI disease or malignancy  Review of Systems  Constitutional:  Negative for activity change, appetite change, chills, diaphoresis, fatigue, fever and unexpected weight change.  HENT:  Positive for trouble swallowing. Negative for voice change.   Respiratory:  Negative for shortness of breath and wheezing.   Cardiovascular:  Negative for chest pain, palpitations and leg swelling.  Gastrointestinal:  Positive for constipation. Negative for abdominal distention, abdominal pain, anal bleeding, blood in stool, diarrhea, nausea, rectal pain and vomiting.  Musculoskeletal:  Negative for arthralgias and myalgias.  Skin:  Negative for color change and pallor.  Neurological:  Negative for dizziness, syncope and weakness.  Psychiatric/Behavioral:  Negative for confusion.   All other  systems reviewed and are negative.    Medications No current facility-administered medications on file prior to encounter.   Current Outpatient Medications on File Prior to Encounter  Medication Sig Dispense Refill   cyclobenzaprine  (FLEXERIL ) 10 MG tablet Take 1 tablet (10 mg total) by mouth at bedtime. 10 tablet 0   gabapentin (NEURONTIN) 300 MG capsule Take 300 mg in the morning and  600 mg at night     gabapentin (NEURONTIN) 600 MG tablet Take by mouth.     nortriptyline (PAMELOR) 10 MG capsule Take 20 mg by mouth daily. Take 20 mg qam and 50 mg at bedtime     Vitamin D, Ergocalciferol, (DRISDOL) 1.25 MG (50000 UNIT) CAPS capsule Take 50,000 Units by mouth every 7 (seven) days.      Pertinent medications related to GI and procedure were reviewed by me with the patient prior to the procedure   Current Facility-Administered Medications:    0.9 %  sodium chloride  infusion, , Intravenous, Continuous, [name], DO, Last Rate: 20 mL/hr at 03/13/24 1258, New Bag at 03/13/24 1258  sodium chloride  20 mL/hr at 03/13/24 1258       Allergies  Allergen Reactions   Acetaminophen  Itching   Hydrocodone -Acetaminophen  Itching   Allergies were reviewed by me prior to the procedure  Objective   Body mass index is 28.99 kg/m. Vitals:   03/13/24 1243  BP: 110/73  Pulse: 79  Resp: 18  Temp: (!) 96.2 F (35.7 C)  TempSrc: Temporal  SpO2: 100%  Height: 5\' 2"  (1.575 m)     Physical Exam Vitals and nursing note reviewed.  Constitutional:  General: She is not in acute distress.    Appearance: Normal appearance. She is not ill-appearing, toxic-appearing or diaphoretic.  HENT:     Head: Normocephalic and atraumatic.     Nose: Nose normal.     Mouth/Throat:     Mouth: Mucous membranes are moist.     Pharynx: Oropharynx is clear.  Eyes:     General: No scleral icterus.    Extraocular Movements: Extraocular movements intact.  Cardiovascular:     Rate and Rhythm:  Normal rate and regular rhythm.     Heart sounds: Normal heart sounds. No murmur heard.    No friction rub. No gallop.  Pulmonary:     Effort: Pulmonary effort is normal. No respiratory distress.     Breath sounds: Normal breath sounds. No wheezing, rhonchi or rales.  Abdominal:     General: Bowel sounds are normal. There is no distension.     Palpations: Abdomen is soft.     Tenderness: There is no abdominal tenderness. There is no guarding or rebound.  Musculoskeletal:     Cervical back: Neck supple.     Right lower leg: No edema.     Left lower leg: No edema.  Skin:    General: Skin is warm and dry.     Coloration: Skin is not jaundiced or pale.  Neurological:     General: No focal deficit present.     Mental Status: She is alert and oriented to person, place, and time. Mental status is at baseline.  Psychiatric:        Mood and Affect: Mood normal.        Behavior: Behavior normal.        Thought Content: Thought content normal.        Judgment: Judgment normal.      Assessment:  Ms. Jaclyn Jackson is a 43 y.o. female  who presents today for Esophagogastroduodenoscopy and Colonoscopy for Iron  deficiency anemia, GERD, pill dysphagia, constipation .  Plan:  Esophagogastroduodenoscopy and Colonoscopy with possible intervention today  Esophagogastroduodenoscopy and Colonoscopy with possible biopsy, control of bleeding, polypectomy, and interventions as necessary has been discussed with the patient/patient representative. Informed consent was obtained from the patient/patient representative after explaining the indication, nature, and risks of the procedure including but not limited to death, bleeding, perforation, missed neoplasm/lesions, cardiorespiratory compromise, and reaction to medications. Opportunity for questions was given and appropriate answers were provided. Patient/patient representative has verbalized understanding is amenable to undergoing the  procedure.   [name], DO  [facility]  Portions of the record may have been created with voice recognition software. Occasional wrong-word or 'sound-a-like' substitutions may have occurred due to the inherent limitations of voice recognition software.  Read the chart carefully and recognize, using context, where substitutions may have occurred.

## 2024-03-13 NOTE — Op Note (Signed)
 [facility] Patient Name: Jaclyn Jackson Procedure Date: 03/13/2024 9:35 AM MRN: [id number] Account #: [id number] Date of Birth: 1981-02-27 Admit Type: Outpatient Age: 43 Room: [facility] ENDO ROOM 1 Gender: Female Note Status: Finalized Instrument Name: Colonscope [id number] Procedure:             Colonoscopy Indications:           Iron  deficiency anemia, Constipation Providers:             [name] DO, DO Medicines:             Monitored Anesthesia Care Complications:         No immediate complications. Estimated blood loss: None. Procedure:             Pre-Anesthesia Assessment:                        - Prior to the procedure, a History and Physical was                         performed, and patient medications and allergies were                         reviewed. The patient is competent. The risks and                         benefits of the procedure and the sedation options and                         risks were discussed with the patient. All questions                         were answered and informed consent was obtained.                         Patient identification and proposed procedure were                         verified by the physician, the nurse, the anesthetist                         and the technician in the endoscopy suite. Mental                         Status Examination: alert and oriented. Airway                         Examination: normal oropharyngeal airway and neck                         mobility. Respiratory Examination: clear to                         auscultation. CV Examination: RRR, no murmurs, no S3                         or S4. Prophylactic Antibiotics: The patient does not                         require  prophylactic antibiotics. Prior                         Anticoagulants: The patient has taken no anticoagulant                         or antiplatelet agents. ASA Grade Assessment: II - A                          patient with mild systemic disease. After reviewing                         the risks and benefits, the patient was deemed in                         satisfactory condition to undergo the procedure. The                         anesthesia plan was to use monitored anesthesia care                         (MAC). Immediately prior to administration of                         medications, the patient was re-assessed for adequacy                         to receive sedatives. The heart rate, respiratory                         rate, oxygen saturations, blood pressure, adequacy of                         pulmonary ventilation, and response to care were                         monitored throughout the procedure. The physical                         status of the patient was re-assessed after the                         procedure.                        After obtaining informed consent, the colonoscope was                         passed under direct vision. Throughout the procedure,                         the patient's blood pressure, pulse, and oxygen                         saturations were monitored continuously. The                         Colonoscope was introduced through the anus with the  intention of advancing to the cecum. The scope was                         advanced to the sigmoid colon before the procedure was                         aborted. Medications were given. The colonoscopy was                         aborted due to poor bowel prep with stool present. The                         quality of the bowel preparation was poor. The rectum                         was photographed. The patient tolerated the procedure                         well. Findings:      The perianal and digital rectal examinations were normal. Pertinent       negatives include normal sphincter tone.      Extensive amounts of copious quantities of semi-liquid semi-solid stool        was found in the rectum and in the sigmoid colon, precluding       visualization. Estimated blood loss: none.      Due to significant amount of stool, the procedure was aborted as unable       to visualize mucosa and lumen at times. Estimated blood loss: none. Impression:            - The procedure was aborted due to poor bowel prep                         with stool present.                        - Preparation of the colon was poor.                        - Stool in the rectum and in the sigmoid colon.                        - No specimens collected.                        -                        - El procedimiento se suspendi debido a una                         preparacin intestinal deficiente con presencia de                         heces.                        - La preparacin del colon fue deficiente.                        -  Heces en el recto y el colon sigmoide.                        - No se recogieron muestras. Recommendation:        - Patient has a contact number available for                         emergencies. The signs and symptoms of potential                         delayed complications were discussed with the patient.                         Return to normal activities tomorrow. Written                         discharge instructions were provided to the patient.                        - Discharge patient to home.                        - Resume previous diet.                        - Continue present medications.                        - Recommend addressing constipation                        Can reschedule colonoscopy for iron  deficiency anemia                         at a later date and time with better prep.                        - Return to GI office as previously scheduled.                        - The findings and recommendations were discussed with                         the patient. Procedure Code(s):     --- Professional ---                         [id number], 53, Colonoscopy, flexible; diagnostic,                         including collection of specimen(s) by brushing or                         washing, when performed (separate procedure) Diagnosis Code(s):     --- Professional ---                        D50.9, Iron  deficiency anemia, unspecified                        K59.00, Constipation, unspecified CPT copyright 2022 American  Medical Association. All rights reserved. The codes documented in this report are preliminary and upon coder review may  be revised to meet current compliance requirements. Attending Participation:      I personally performed the entire procedure. Polo Brisk, DO [name] DO, DO 03/13/2024 1:51:51 PM This report has been signed electronically. Number of Addenda: 0 Note Initiated On: 03/13/2024 9:35 AM Total Procedure Duration: 0 hours 2 minutes 3 seconds  Estimated Blood Loss:  Estimated blood loss: none.      [facility]

## 2024-03-13 NOTE — Progress Notes (Signed)
 Aborted colonoscopy per Dr. Mamie Searles patient not clean

## 2024-03-13 NOTE — Anesthesia Procedure Notes (Signed)
 Procedure Name: General with mask airway Date/Time: 03/13/2024 1:31 PM  Performed by: Niki Barter, CRNAPre-anesthesia Checklist: Patient identified, Emergency Drugs available, Suction available and Patient being monitored Patient Re-evaluated:Patient Re-evaluated prior to induction Oxygen Delivery Method: Simple face mask Induction Type: IV induction Placement Confirmation: positive ETCO2 and breath sounds checked- equal and bilateral Dental Injury: Teeth and Oropharynx as per pre-operative assessment

## 2024-03-13 NOTE — Transfer of Care (Signed)
 Immediate Anesthesia Transfer of Care Note  Patient: Jaclyn Jackson  Procedure(s) Performed: COLONOSCOPY EGD (ESOPHAGOGASTRODUODENOSCOPY)  Patient Location: Endoscopy Unit  Anesthesia Type:General  Level of Consciousness: drowsy and patient cooperative  Airway & Oxygen Therapy: Patient Spontanous Breathing and Patient connected to face mask oxygen  Post-op Assessment: Report given to RN and Post -op Vital signs reviewed and stable  Post vital signs: Reviewed and stable  Last Vitals:  Vitals Value Taken Time  BP 97/68 03/13/24 1350  Temp    Pulse 83 03/13/24 1350  Resp 27 03/13/24 1350  SpO2 100 % 03/13/24 1350    Last Pain:  Vitals:   03/13/24 1350  TempSrc:   PainSc: Asleep         Complications: No notable events documented.

## 2024-03-13 NOTE — Op Note (Signed)
 [facility] Gastroenterology Patient Name: Jaclyn Jackson Procedure Date: 03/13/2024 9:30 AM MRN: [id number] Account #: [id number] Date of Birth: Aug 19, 1981 Admit Type: Outpatient Age: 43 Room: [facility] ENDO ROOM 1 Gender: Female Note Status: Finalized Instrument Name: Upper Endoscope [id number] Procedure:             Upper GI endoscopy Indications:           Iron  deficiency anemia, Dysphagia Providers:             [name] DO, DO Medicines:             Monitored Anesthesia Care Complications:         No immediate complications. Estimated blood loss:                         Minimal. Procedure:             Pre-Anesthesia Assessment:                        - Prior to the procedure, a History and Physical was                         performed, and patient medications and allergies were                         reviewed. The patient is competent. The risks and                         benefits of the procedure and the sedation options and                         risks were discussed with the patient. All questions                         were answered and informed consent was obtained.                         Patient identification and proposed procedure were                         verified by the physician, the nurse, the anesthetist                         and the technician in the endoscopy suite. Mental                         Status Examination: alert and oriented. Airway                         Examination: normal oropharyngeal airway and neck                         mobility. Respiratory Examination: clear to                         auscultation. CV Examination: RRR, no murmurs, no S3                         or S4. Prophylactic Antibiotics: The patient  does not                         require prophylactic antibiotics. Prior                         Anticoagulants: The patient has taken no anticoagulant                         or antiplatelet agents. ASA  Grade Assessment: II - A                         patient with mild systemic disease. After reviewing                         the risks and benefits, the patient was deemed in                         satisfactory condition to undergo the procedure. The                         anesthesia plan was to use monitored anesthesia care                         (MAC). Immediately prior to administration of                         medications, the patient was re-assessed for adequacy                         to receive sedatives. The heart rate, respiratory                         rate, oxygen saturations, blood pressure, adequacy of                         pulmonary ventilation, and response to care were                         monitored throughout the procedure. The physical                         status of the patient was re-assessed after the                         procedure.                        After obtaining informed consent, the endoscope was                         passed under direct vision. Throughout the procedure,                         the patient's blood pressure, pulse, and oxygen                         saturations were monitored continuously. The Endoscope  was introduced through the mouth, and advanced to the                         second part of duodenum. The upper GI endoscopy was                         accomplished without difficulty. The patient tolerated                         the procedure well. Findings:      The duodenal bulb, first portion of the duodenum and second portion of       the duodenum were normal. Biopsies for histology were taken with a cold       forceps for evaluation of celiac disease. Estimated blood loss was       minimal.      Localized mild inflammation characterized by erythema was found in the       gastric antrum. Biopsies were taken with a cold forceps for Helicobacter       pylori testing. Estimated blood loss was  minimal.      The exam of the stomach was otherwise normal.      Esophagogastric landmarks were identified: the gastroesophageal junction       was found at 40 cm from the incisors.      The Z-line was <1cm of variability. Estimated blood loss: none.      No endoscopic abnormality was evident in the esophagus to explain the       patient's complaint of dysphagia. Estimated blood loss: none.      The exam of the esophagus was otherwise normal. Impression:            - Normal duodenal bulb, first portion of the duodenum                         and second portion of the duodenum. Biopsied.                        - Gastritis. Biopsied.                        - Esophagogastric landmarks identified.                        - Z-line <1cm of variability.                        - No endoscopic esophageal abnormality to explain                         patient's dysphagia.                        -                        - Bulbo duodenal, primera y segunda porcin del                         duodeno normales. Biopsiada.                        - Gastritis. Biopsiada.                        -  Puntos de referencia esofagogstricos identificados.                        - Variabilidad de la lnea Z <1 cm.                        - Sin anomala esofgica endoscpica que explique la                         disfagia del [place]. Recommendation:        - Patient has a contact number available for                         emergencies. The signs and symptoms of potential                         delayed complications were discussed with the patient.                         Return to normal activities tomorrow. Written                         discharge instructions were provided to the patient.                        - Discharge patient to home.                        - Resume previous diet.                        - Continue present medications.                        - Await pathology results.                         - Return to GI office as previously scheduled.                        - The findings and recommendations were discussed with                         the patient. Procedure Code(s):     --- Professional ---                        [id number], Esophagogastroduodenoscopy, flexible,                         transoral; with biopsy, single or multiple Diagnosis Code(s):     --- Professional ---                        K29.70, Gastritis, unspecified, without bleeding                        R13.10, Dysphagia, unspecified                        D50.9, Iron  deficiency anemia, unspecified CPT copyright 2022 American [employer] Association. All rights reserved. The codes documented in  this report are preliminary and upon coder review may  be revised to meet current compliance requirements. Attending Participation:      I personally performed the entire procedure. Polo Brisk, DO [name] DO, DO 03/13/2024 1:47:52 PM This report has been signed electronically. Number of Addenda: 0 Note Initiated On: 03/13/2024 9:30 AM Estimated Blood Loss:  Estimated blood loss was minimal.      [facility]

## 2024-03-13 NOTE — Interval H&P Note (Signed)
 History and Physical Interval Note: Preprocedure H&P from 03/13/24  was reviewed and there was no interval change after seeing and examining the patient.  Written consent was obtained from the patient after discussion of risks, benefits, and alternatives. Patient has consented to proceed with Esophagogastroduodenoscopy and Colonoscopy with possible intervention   03/13/2024 1:11 PM  Jaclyn Jackson  has presented today for surgery, with the diagnosis of Other iron  deficiency anemia (D50.8) Symptomatic anemia (D64.9) Chronic constipation (K59.09) Gastroesophageal reflux disease, unspecified whether esophagitis present (K21.9) Pill dysphagia (R13.10).  The various methods of treatment have been discussed with the patient and family. After consideration of risks, benefits and other options for treatment, the patient has consented to  Procedure(s): COLONOSCOPY (N/A) EGD (ESOPHAGOGASTRODUODENOSCOPY) (N/A) as a surgical intervention.  The patient's history has been reviewed, patient examined, no change in status, stable for surgery.  I have reviewed the patient's chart and labs.  Questions were answered to the patient's satisfaction.     [name]

## 2024-03-13 NOTE — Anesthesia Preprocedure Evaluation (Signed)
 Anesthesia Evaluation  Patient identified by MRN, date of birth, ID band Patient awake    Reviewed: Allergy & Precautions, H&P , NPO status , Patient's Chart, lab work & pertinent test results, reviewed documented beta blocker date and time   Airway Mallampati: II   Neck ROM: full    Dental  (+) Poor Dentition   Pulmonary neg pulmonary ROS   Pulmonary exam normal        Cardiovascular Exercise Tolerance: Good negative cardio ROS Normal cardiovascular exam Rhythm:regular Rate:Normal     Neuro/Psych  Headaches  negative psych ROS   GI/Hepatic negative GI ROS, Neg liver ROS,,,  Endo/Other  negative endocrine ROS    Renal/GU negative Renal ROS  negative genitourinary   Musculoskeletal   Abdominal   Peds  Hematology  (+) Blood dyscrasia, anemia   Anesthesia Other Findings Past Medical History: No date: Headache No date: Photophobia Past Surgical History: No date: ABDOMINAL SURGERY No date: CESAREAN SECTION No date: TUBAL LIGATION BMI    Body Mass Index: 28.99 kg/m     Reproductive/Obstetrics negative OB ROS                             Anesthesia Physical Anesthesia Plan  ASA: 2  Anesthesia Plan: General   Post-op Pain Management:    Induction:   PONV Risk Score and Plan:   Airway Management Planned:   Additional Equipment:   Intra-op Plan:   Post-operative Plan:   Informed Consent: I have reviewed the patients History and Physical, chart, labs and discussed the procedure including the risks, benefits and alternatives for the proposed anesthesia with the patient or authorized representative who has indicated his/her understanding and acceptance.     Dental Advisory Given  Plan Discussed with: CRNA  Anesthesia Plan Comments:        Anesthesia Quick Evaluation

## 2024-03-14 ENCOUNTER — Encounter: Payer: Self-pay | Admitting: Gastroenterology

## 2024-03-14 LAB — SURGICAL PATHOLOGY

## 2024-03-14 NOTE — Anesthesia Postprocedure Evaluation (Signed)
 Anesthesia Post Note  Patient: Jaclyn Jackson  Procedure(s) Performed: COLONOSCOPY EGD (ESOPHAGOGASTRODUODENOSCOPY)  Patient location during evaluation: PACU Anesthesia Type: General Level of consciousness: awake and alert Pain management: pain level controlled Vital Signs Assessment: post-procedure vital signs reviewed and stable Respiratory status: spontaneous breathing, nonlabored ventilation, respiratory function stable and patient connected to nasal cannula oxygen Cardiovascular status: blood pressure returned to baseline and stable Postop Assessment: no apparent nausea or vomiting Anesthetic complications: no   No notable events documented.   Last Vitals:  Vitals:   03/13/24 1400 03/13/24 1410  BP: 107/74 111/73  Pulse: 89 81  Resp: 18 14  Temp:    SpO2: 99% 100%    Last Pain:  Vitals:   03/14/24 0745  TempSrc:   PainSc: 0-No pain                 Zula Hitch

## 2024-04-01 ENCOUNTER — Encounter: Payer: Self-pay | Admitting: Gastroenterology

## 2024-04-24 ENCOUNTER — Ambulatory Visit: Admitting: Anesthesiology

## 2024-04-24 ENCOUNTER — Ambulatory Visit
Admission: RE | Admit: 2024-04-24 | Discharge: 2024-04-24 | Disposition: A | Discharge status: Home / Self Care | Attending: Gastroenterology | Admitting: Gastroenterology

## 2024-04-24 ENCOUNTER — Other Ambulatory Visit: Payer: Self-pay

## 2024-04-24 ENCOUNTER — Encounter: Payer: Self-pay | Admitting: Gastroenterology

## 2024-04-24 ENCOUNTER — Encounter
Admission: RE | Disposition: A | Discharge status: Home / Self Care | Payer: Self-pay | Source: Home / Self Care | Attending: Gastroenterology

## 2024-04-24 DIAGNOSIS — K5909 Other constipation: Secondary | ICD-10-CM | POA: Insufficient documentation

## 2024-04-24 DIAGNOSIS — D509 Iron deficiency anemia, unspecified: Secondary | ICD-10-CM | POA: Insufficient documentation

## 2024-04-24 DIAGNOSIS — K621 Rectal polyp: Secondary | ICD-10-CM | POA: Insufficient documentation

## 2024-04-24 HISTORY — PX: COLONOSCOPY: SHX5424

## 2024-04-24 HISTORY — DX: Age-related osteoporosis without current pathological fracture: M81.0

## 2024-04-24 HISTORY — DX: Unspecified ovarian cyst, unspecified side: N83.209

## 2024-04-24 SURGERY — COLONOSCOPY
Anesthesia: General

## 2024-04-24 MED ORDER — SODIUM CHLORIDE 0.9 % IV SOLN: INTRAVENOUS | Status: DC

## 2024-04-24 MED ORDER — LIDOCAINE HCL (PF) 2 % IJ SOLN
INTRAMUSCULAR | Status: AC
  Filled 2024-04-24: qty 5

## 2024-04-24 MED ORDER — LIDOCAINE HCL (CARDIAC) PF 100 MG/5ML IV SOSY
PREFILLED_SYRINGE | INTRAVENOUS | Status: DC | PRN
  Administered 2024-04-24: 60 mg via INTRAVENOUS

## 2024-04-24 MED ORDER — PROPOFOL 1000 MG/100ML IV EMUL
INTRAVENOUS | Status: AC
  Filled 2024-04-24: qty 100

## 2024-04-24 MED ORDER — DEXMEDETOMIDINE HCL IN NACL 80 MCG/20ML IV SOLN
INTRAVENOUS | Status: DC | PRN
  Administered 2024-04-24: 20 ug via INTRAVENOUS

## 2024-04-24 MED ORDER — PROPOFOL 500 MG/50ML IV EMUL
INTRAVENOUS | Status: DC | PRN
  Administered 2024-04-24: 75 ug/kg/min via INTRAVENOUS

## 2024-04-24 MED ORDER — PROPOFOL 10 MG/ML IV BOLUS
INTRAVENOUS | Status: DC | PRN
  Administered 2024-04-24: 50 mg via INTRAVENOUS
  Administered 2024-04-24: 30 mg via INTRAVENOUS
  Administered 2024-04-24: 50 mg via INTRAVENOUS
  Administered 2024-04-24: 30 mg via INTRAVENOUS

## 2024-04-24 NOTE — H&P (Signed)
 Pre-Procedure H&P   Patient ID: Jaclyn Jackson is a 43 y.o. female.  Gastroenterology Provider: [name], DO  Referring Provider: Jamee Mazzoni, PA PCP: Kenton Pearl, MD  Date: 04/24/2024  HPI Jaclyn Jackson is a 43 y.o. female who presents today for Colonoscopy for Iron  deficiency anemia .  Attempted colonoscopy in May with poor prep.  Pt with h/o ida and constipation. Currently on linzess  Hgb 97 mcv 71 ferritin 5 sat 7%   Past Medical History:  Diagnosis Date   Headache    Osteoporosis    Ovarian cyst    Photophobia     Past Surgical History:  Procedure Laterality Date   ABDOMINAL SURGERY     CESAREAN SECTION     COLONOSCOPY N/A 03/13/2024   Procedure: COLONOSCOPY;  Surgeon: [name], DO;  Location: [facility] ENDOSCOPY;  Service: Gastroenterology;  Laterality: N/A;   ESOPHAGOGASTRODUODENOSCOPY N/A 03/13/2024   Procedure: EGD (ESOPHAGOGASTRODUODENOSCOPY);  Surgeon: [name], DO;  Location: [facility] ENDOSCOPY;  Service: Gastroenterology;  Laterality: N/A;   TUBAL LIGATION      Family History Grandmother's Sister (great aunt)- crc No h/o GI disease or malignancy  Review of Systems  Constitutional:  Negative for activity change, appetite change, chills, diaphoresis, fatigue, fever and unexpected weight change.  HENT:  Negative for trouble swallowing and voice change.   Respiratory:  Negative for shortness of breath and wheezing.   Cardiovascular:  Negative for chest pain, palpitations and leg swelling.  Gastrointestinal:  Positive for constipation. Negative for abdominal distention, abdominal pain, anal bleeding, blood in stool, diarrhea, nausea, rectal pain and vomiting.  Musculoskeletal:  Negative for arthralgias and myalgias.  Skin:  Negative for color change and pallor.  Neurological:  Negative for dizziness, syncope and weakness.  Psychiatric/Behavioral:  Negative for confusion.   All other systems reviewed and  are negative.    Medications No current facility-administered medications on file prior to encounter.   Current Outpatient Medications on File Prior to Encounter  Medication Sig Dispense Refill   gabapentin (NEURONTIN) 300 MG capsule Take 300 mg in the morning and  600 mg at night     Vitamin D, Ergocalciferol, (DRISDOL) 1.25 MG (50000 UNIT) CAPS capsule Take 50,000 Units by mouth every 7 (seven) days.     cyclobenzaprine  (FLEXERIL ) 10 MG tablet Take 1 tablet (10 mg total) by mouth at bedtime. 10 tablet 0   gabapentin (NEURONTIN) 600 MG tablet Take by mouth.     linaclotide (LINZESS) 145 MCG CAPS capsule Take 145 mcg by mouth daily before breakfast.     nortriptyline (PAMELOR) 10 MG capsule Take 20 mg by mouth daily. Take 20 mg qam and 50 mg at bedtime      Pertinent medications related to GI and procedure were reviewed by me with the patient prior to the procedure   Current Facility-Administered Medications:    0.9 %  sodium chloride  infusion, , Intravenous, Continuous, [name], DO, Last Rate: 20 mL/hr at 04/24/24 0901, Continued from Pre-op at 04/24/24 0901  sodium chloride  20 mL/hr at 04/24/24 0901       Allergies  Allergen Reactions   Acetaminophen  Itching   Hydrocodone -Acetaminophen  Itching   Allergies were reviewed by me prior to the procedure  Objective   Body mass index is 29.08 kg/m. Vitals:   04/17/24 1153 04/24/24 0855  BP:  113/81  Pulse:  89  Resp:  16  Temp:  (!) 97.2 F (36.2 C)  TempSrc:  Temporal  SpO2:  100%  Weight: 70.8 kg 72.1 kg  Height: 5' 2 (1.575 m)      Physical Exam Vitals and nursing note reviewed.  Constitutional:      General: She is not in acute distress.    Appearance: Normal appearance. She is not ill-appearing, toxic-appearing or diaphoretic.  HENT:     Head: Normocephalic and atraumatic.     Nose: Nose normal.     Mouth/Throat:     Mouth: Mucous membranes are moist.     Pharynx: Oropharynx is clear.    Eyes:     General: No scleral icterus.    Extraocular Movements: Extraocular movements intact.    Cardiovascular:     Rate and Rhythm: Normal rate and regular rhythm.     Heart sounds: Normal heart sounds. No murmur heard.    No friction rub. No gallop.  Pulmonary:     Effort: Pulmonary effort is normal. No respiratory distress.     Breath sounds: Normal breath sounds. No wheezing, rhonchi or rales.  Abdominal:     General: Bowel sounds are normal. There is no distension.     Palpations: Abdomen is soft.     Tenderness: There is no abdominal tenderness. There is no guarding or rebound.   Musculoskeletal:     Cervical back: Neck supple.     Right lower leg: No edema.     Left lower leg: No edema.   Skin:    General: Skin is warm and dry.     Coloration: Skin is not jaundiced or pale.   Neurological:     General: No focal deficit present.     Mental Status: She is alert and oriented to person, place, and time. Mental status is at baseline.   Psychiatric:        Mood and Affect: Mood normal.        Behavior: Behavior normal.        Thought Content: Thought content normal.        Judgment: Judgment normal.      Assessment:  Jaclyn Jackson is a 43 y.o. female  who presents today for Colonoscopy for Iron  deficiency anemia .  Plan:  Colonoscopy with possible intervention today  Colonoscopy with possible biopsy, control of bleeding, polypectomy, and interventions as necessary has been discussed with the patient/patient representative. Informed consent was obtained from the patient/patient representative after explaining the indication, nature, and risks of the procedure including but not limited to death, bleeding, perforation, missed neoplasm/lesions, cardiorespiratory compromise, and reaction to medications. Opportunity for questions was given and appropriate answers were provided. Patient/patient representative has verbalized understanding is amenable to undergoing  the procedure.   [name], DO  [facility] Gastroenterology  Portions of the record may have been created with voice recognition software. Occasional wrong-word or 'sound-a-like' substitutions may have occurred due to the inherent limitations of voice recognition software.  Read the chart carefully and recognize, using context, where substitutions may have occurred.

## 2024-04-24 NOTE — Op Note (Signed)
 [facility] Gastroenterology Patient Name: Jaclyn Jackson Procedure Date: 04/24/2024 9:34 AM MRN: [id number] Account #: [id number] Date of Birth: 05-12-1981 Admit Type: Outpatient Age: 43 Room: [facility] ENDO ROOM 1 Gender: Female Note Status: Finalized Instrument Name: Colonoscope [id number] Procedure:             Colonoscopy Indications:           Iron  deficiency anemia Providers:             Bridgett Camps, DO Referring MD:          Kenton Pearl (Referring MD) Medicines:             Monitored Anesthesia Care Complications:         No immediate complications. Estimated blood loss:                         Minimal. Procedure:             Pre-Anesthesia Assessment:                        - Prior to the procedure, a History and Physical was                         performed, and patient medications and allergies were                         reviewed. The patient is competent. The risks and                         benefits of the procedure and the sedation options and                         risks were discussed with the patient. All questions                         were answered and informed consent was obtained.                         Patient identification and proposed procedure were                         verified by the physician, the nurse, the anesthetist                         and the technician in the endoscopy suite. Mental                         Status Examination: alert and oriented. Airway                         Examination: normal oropharyngeal airway and neck                         mobility. Respiratory Examination: clear to                         auscultation. CV Examination: RRR, no murmurs, no S3  or S4. Prophylactic Antibiotics: The patient does not                         require prophylactic antibiotics. Prior                         Anticoagulants: The patient has taken no anticoagulant                          or antiplatelet agents. ASA Grade Assessment: I - A                         normal, healthy patient. After reviewing the risks and                         benefits, the patient was deemed in satisfactory                         condition to undergo the procedure. The anesthesia                         plan was to use monitored anesthesia care (MAC).                         Immediately prior to administration of medications,                         the patient was re-assessed for adequacy to receive                         sedatives. The heart rate, respiratory rate, oxygen                         saturations, blood pressure, adequacy of pulmonary                         ventilation, and response to care were monitored                         throughout the procedure. The physical status of the                         patient was re-assessed after the procedure.                        After obtaining informed consent, the colonoscope was                         passed under direct vision. Throughout the procedure,                         the patient's blood pressure, pulse, and oxygen                         saturations were monitored continuously. The                         Colonoscope was introduced through the anus and  advanced to the the terminal ileum, with                         identification of the appendiceal orifice and IC                         valve. The colonoscopy was performed without                         difficulty. The patient tolerated the procedure well.                         The quality of the bowel preparation was evaluated                         using the BBPS [facility] Bowel Preparation Scale) with                         scores of: Right Colon = 2 (minor amount of residual                         staining, small fragments of stool and/or opaque                         liquid, but mucosa seen well), Transverse Colon = 2                          (minor amount of residual staining, small fragments of                         stool and/or opaque liquid, but mucosa seen well) and                         Left Colon = 2 (minor amount of residual staining,                         small fragments of stool and/or opaque liquid, but                         mucosa seen well). The total BBPS score equals 6. The                         quality of the bowel preparation was good. The                         terminal ileum, ileocecal valve, appendiceal orifice,                         and rectum were photographed. Findings:      The perianal and digital rectal examinations were normal. Pertinent       negatives include normal sphincter tone.      The terminal ileum appeared normal. Estimated blood loss: none.      Two sessile polyps were found in the rectum. The polyps were 1 to 2 mm       in size. These polyps were removed with a jumbo cold forceps. Resection  and retrieval were complete. Estimated blood loss was minimal.      The exam was otherwise without abnormality on direct and retroflexion       views. Impression:            - The examined portion of the ileum was normal.                        - Two 1 to 2 mm polyps in the rectum, removed with a                         jumbo cold forceps. Resected and retrieved.                        - The examination was otherwise normal on direct and                         retroflexion views. Recommendation:        - Patient has a contact number available for                         emergencies. The signs and symptoms of potential                         delayed complications were discussed with the patient.                         Return to normal activities tomorrow. Written                         discharge instructions were provided to the patient.                        - Discharge patient to home.                        - Resume previous diet.                        -  Continue present medications.                        - Await pathology results.                        - Repeat colonoscopy for surveillance based on                         pathology results.                        - Return to referring physician as previously                         scheduled.                        - The findings and recommendations were discussed with                         the patient. Procedure Code(s):     ---  Professional ---                        [phone], Colonoscopy, flexible; with biopsy, single or                         multiple Diagnosis Code(s):     --- Professional ---                        D12.8, Benign neoplasm of rectum                        D50.9, Iron  deficiency anemia, unspecified CPT copyright 2022 American Medical Association. All rights reserved. The codes documented in this report are preliminary and upon coder review may  be revised to meet current compliance requirements. Attending Participation:      I personally performed the entire procedure. Polo Brisk, DO [name] DO, DO 04/24/2024 10:19:25 AM This report has been signed electronically. Number of Addenda: 0 Note Initiated On: 04/24/2024 9:34 AM Scope Withdrawal Time: 0 hours 11 minutes 55 seconds  Total Procedure Duration: 0 hours 19 minutes 20 seconds  Estimated Blood Loss:  Estimated blood loss was minimal.      [facility]

## 2024-04-24 NOTE — Transfer of Care (Signed)
 Immediate Anesthesia Transfer of Care Note  Patient: Jaclyn Jackson  Procedure(s) Performed: COLONOSCOPY  Patient Location: PACU  Anesthesia Type:General  Level of Consciousness: sedated  Airway & Oxygen Therapy: Patient Spontanous Breathing  Post-op Assessment: Report given to RN and Post -op Vital signs reviewed and stable  Post vital signs: Reviewed and stable  Last Vitals:  Vitals Value Taken Time  BP 99/65 04/24/24 10:16  Temp    Pulse 74 04/24/24 10:16  Resp 15 04/24/24 10:16  SpO2 100 % 04/24/24 10:16  Vitals shown include unfiled device data.  Last Pain:  Vitals:   04/24/24 0855  TempSrc: Temporal  PainSc: 0-No pain         Complications: No notable events documented.

## 2024-04-24 NOTE — Anesthesia Preprocedure Evaluation (Addendum)
 Anesthesia Evaluation  Patient identified by MRN, date of birth, ID band Patient awake    Reviewed: Allergy & Precautions, H&P , NPO status , Patient's Chart, lab work & pertinent test results, reviewed documented beta blocker date and time   Airway Mallampati: I   Neck ROM: full    Dental no notable dental hx.    Pulmonary neg pulmonary ROS   Pulmonary exam normal        Cardiovascular Exercise Tolerance: Good negative cardio ROS Normal cardiovascular exam Rhythm:regular Rate:Normal     Neuro/Psych negative neurological ROS  negative psych ROS   GI/Hepatic negative GI ROS, Neg liver ROS,,,  Endo/Other  negative endocrine ROS    Renal/GU negative Renal ROS  negative genitourinary   Musculoskeletal   Abdominal Normal abdominal exam  (+)   Peds  Hematology  (+) Blood dyscrasia, anemia   Anesthesia Other Findings Past Medical History: No date: Headache No date: Photophobia Past Surgical History: No date: ABDOMINAL SURGERY No date: CESAREAN SECTION No date: TUBAL LIGATION BMI    Body Mass Index: 28.99 kg/m     Reproductive/Obstetrics negative OB ROS                             Anesthesia Physical Anesthesia Plan  ASA: 1  Anesthesia Plan: General   Post-op Pain Management:    Induction: Intravenous  PONV Risk Score and Plan:   Airway Management Planned: Natural Airway  Additional Equipment:   Intra-op Plan:   Post-operative Plan:   Informed Consent: I have reviewed the patients History and Physical, chart, labs and discussed the procedure including the risks, benefits and alternatives for the proposed anesthesia with the patient or authorized representative who has indicated his/her understanding and acceptance.     Dental Advisory Given  Plan Discussed with: CRNA  Anesthesia Plan Comments:        Anesthesia Quick Evaluation

## 2024-04-24 NOTE — Interval H&P Note (Signed)
 History and Physical Interval Note: Preprocedure H&P from 04/24/24  was reviewed and there was no interval change after seeing and examining the patient.  Written consent was obtained from the patient after discussion of risks, benefits, and alternatives. Patient has consented to proceed with Colonoscopy with possible intervention   04/24/2024 9:42 AM  Jaclyn Jackson  has presented today for surgery, with the diagnosis of Other iron  deficiency anemia (D50.8) Symptomatic anemia (D64.9) Chronic constipation (K59.09).  The various methods of treatment have been discussed with the patient and family. After consideration of risks, benefits and other options for treatment, the patient has consented to  Procedure(s) with comments: COLONOSCOPY (N/A) - SPANISH INTERPRETER as a surgical intervention.  The patient's history has been reviewed, patient examined, no change in status, stable for surgery.  I have reviewed the patient's chart and labs.  Questions were answered to the patient's satisfaction.     [name]

## 2024-04-25 ENCOUNTER — Encounter: Payer: Self-pay | Admitting: Gastroenterology

## 2024-04-25 LAB — SURGICAL PATHOLOGY

## 2024-04-25 NOTE — Anesthesia Postprocedure Evaluation (Signed)
 Anesthesia Post Note  Patient: Leeann Pucker Bergman  Procedure(s) Performed: COLONOSCOPY  Patient location during evaluation: PACU Anesthesia Type: General Level of consciousness: awake and alert Pain management: pain level controlled Vital Signs Assessment: post-procedure vital signs reviewed and stable Respiratory status: spontaneous breathing, nonlabored ventilation and respiratory function stable Cardiovascular status: blood pressure returned to baseline and stable Postop Assessment: no apparent nausea or vomiting Anesthetic complications: no   No notable events documented.   Last Vitals:  Vitals:   04/24/24 1026 04/24/24 1036  BP: 114/72   Pulse:    Resp:    Temp:    SpO2:  100%    Last Pain:  Vitals:   04/25/24 0800  TempSrc:   PainSc: 0-No pain                 Baltazar Bonier

## 2024-05-21 ENCOUNTER — Other Ambulatory Visit: Payer: Self-pay | Admitting: Nurse Practitioner

## 2024-05-21 DIAGNOSIS — Z1231 Encounter for screening mammogram for malignant neoplasm of breast: Secondary | ICD-10-CM

## 2024-06-10 ENCOUNTER — Ambulatory Visit
Admission: RE | Admit: 2024-06-10 | Discharge: 2024-06-10 | Disposition: A | Discharge status: Home / Self Care | Source: Ambulatory Visit | Attending: Nurse Practitioner | Admitting: Nurse Practitioner

## 2024-06-10 DIAGNOSIS — Z1231 Encounter for screening mammogram for malignant neoplasm of breast: Secondary | ICD-10-CM | POA: Diagnosis present

## 2024-06-11 ENCOUNTER — Other Ambulatory Visit: Payer: Self-pay | Admitting: Physical Medicine & Rehabilitation

## 2024-06-11 ENCOUNTER — Encounter: Payer: Self-pay | Admitting: Physical Medicine & Rehabilitation

## 2024-06-11 DIAGNOSIS — M5412 Radiculopathy, cervical region: Secondary | ICD-10-CM

## 2024-06-13 ENCOUNTER — Other Ambulatory Visit: Payer: Self-pay | Admitting: Nurse Practitioner

## 2024-06-13 DIAGNOSIS — N6489 Other specified disorders of breast: Secondary | ICD-10-CM

## 2024-06-13 DIAGNOSIS — R928 Other abnormal and inconclusive findings on diagnostic imaging of breast: Secondary | ICD-10-CM

## 2024-06-17 ENCOUNTER — Ambulatory Visit
Admission: RE | Admit: 2024-06-17 | Discharge: 2024-06-17 | Disposition: A | Discharge status: Home / Self Care | Source: Ambulatory Visit | Attending: Nurse Practitioner | Admitting: Nurse Practitioner

## 2024-06-17 DIAGNOSIS — N6489 Other specified disorders of breast: Secondary | ICD-10-CM | POA: Insufficient documentation

## 2024-06-17 DIAGNOSIS — R928 Other abnormal and inconclusive findings on diagnostic imaging of breast: Secondary | ICD-10-CM | POA: Diagnosis present

## 2024-06-20 NOTE — Discharge Instructions (Signed)

## 2024-06-23 ENCOUNTER — Inpatient Hospital Stay
Admission: RE | Admit: 2024-06-23 | Discharge: 2024-06-23 | Disposition: A | Discharge status: Home / Self Care | Source: Ambulatory Visit | Attending: Physical Medicine & Rehabilitation | Admitting: Physical Medicine & Rehabilitation

## 2024-06-23 DIAGNOSIS — M5412 Radiculopathy, cervical region: Secondary | ICD-10-CM

## 2024-06-23 MED ORDER — TRIAMCINOLONE ACETONIDE 40 MG/ML IJ SUSP (RADIOLOGY)
60.0000 mg | Freq: Once | INTRAMUSCULAR | Status: AC
Start: 2024-06-23 — End: 2024-06-23
  Administered 2024-06-23: 60 mg via EPIDURAL

## 2024-06-23 MED ORDER — IOPAMIDOL (ISOVUE-M 300) INJECTION 61%
1.0000 mL | Freq: Once | INTRAMUSCULAR | Status: AC | PRN
  Administered 2024-06-23: 1 mL via EPIDURAL

## 2024-07-10 ENCOUNTER — Encounter: Payer: Self-pay | Admitting: Physical Medicine & Rehabilitation

## 2024-07-10 ENCOUNTER — Other Ambulatory Visit: Payer: Self-pay | Admitting: Physical Medicine & Rehabilitation

## 2024-07-10 DIAGNOSIS — M5412 Radiculopathy, cervical region: Secondary | ICD-10-CM

## 2024-07-11 ENCOUNTER — Ambulatory Visit: Payer: Self-pay | Admitting: Family Medicine

## 2024-07-11 ENCOUNTER — Ambulatory Visit
Admit: 2024-07-11 | Discharge: 2024-07-11 | Disposition: A | Discharge status: Home / Self Care | Attending: Family Medicine | Admitting: Family Medicine

## 2024-07-11 ENCOUNTER — Ambulatory Visit
Admission: RE | Admit: 2024-07-11 | Discharge: 2024-07-11 | Disposition: A | Discharge status: Home / Self Care | Payer: Self-pay | Source: Ambulatory Visit | Attending: Family Medicine | Admitting: Family Medicine

## 2024-07-11 VITALS — BP 120/81 | HR 72 | Temp 98.4°F | Resp 14 | Ht 62.0 in | Wt 157.0 lb

## 2024-07-11 DIAGNOSIS — R102 Pelvic and perineal pain: Secondary | ICD-10-CM | POA: Insufficient documentation

## 2024-07-11 DIAGNOSIS — Z3202 Encounter for pregnancy test, result negative: Secondary | ICD-10-CM | POA: Diagnosis not present

## 2024-07-11 DIAGNOSIS — N939 Abnormal uterine and vaginal bleeding, unspecified: Secondary | ICD-10-CM | POA: Insufficient documentation

## 2024-07-11 DIAGNOSIS — D509 Iron deficiency anemia, unspecified: Secondary | ICD-10-CM | POA: Diagnosis present

## 2024-07-11 DIAGNOSIS — M25552 Pain in left hip: Secondary | ICD-10-CM | POA: Diagnosis not present

## 2024-07-11 LAB — CBC WITH DIFFERENTIAL/PLATELET
Abs Immature Granulocytes: 0.03 K/uL (ref 0.00–0.07)
Basophils Absolute: 0 K/uL (ref 0.0–0.1)
Basophils Relative: 0 %
Eosinophils Absolute: 0.1 K/uL (ref 0.0–0.5)
Eosinophils Relative: 2 %
HCT: 35.9 % — ABNORMAL LOW (ref 36.0–46.0)
Hemoglobin: 11.7 g/dL — ABNORMAL LOW (ref 12.0–15.0)
Immature Granulocytes: 1 %
Lymphocytes Relative: 21 %
Lymphs Abs: 1.1 K/uL (ref 0.7–4.0)
MCH: 27 pg (ref 26.0–34.0)
MCHC: 32.6 g/dL (ref 30.0–36.0)
MCV: 82.9 fL (ref 80.0–100.0)
Monocytes Absolute: 0.6 K/uL (ref 0.1–1.0)
Monocytes Relative: 12 %
Neutro Abs: 3.5 K/uL (ref 1.7–7.7)
Neutrophils Relative %: 64 %
Platelets: 177 K/uL (ref 150–400)
RBC: 4.33 MIL/uL (ref 3.87–5.11)
RDW: 15.9 % — ABNORMAL HIGH (ref 11.5–15.5)
WBC: 5.3 K/uL (ref 4.0–10.5)
nRBC: 0 % (ref 0.0–0.2)

## 2024-07-11 LAB — URINALYSIS, W/ REFLEX TO CULTURE (INFECTION SUSPECTED)
Glucose, UA: NEGATIVE mg/dL
Ketones, ur: NEGATIVE mg/dL
Leukocytes,Ua: NEGATIVE
Nitrite: NEGATIVE
Protein, ur: 100 mg/dL — AB
RBC / HPF: >50 RBC/hpf (ref 0–5)
Specific Gravity, Urine: >1.03 — ABNORMAL HIGH (ref 1.005–1.030)
WBC, UA: NONE SEEN WBC/hpf (ref 0–5)
pH: 6 (ref 5.0–8.0)

## 2024-07-11 LAB — BASIC METABOLIC PANEL WITH GFR
Anion gap: 8 (ref 5–15)
BUN: 25 mg/dL — ABNORMAL HIGH (ref 6–20)
CO2: 27 mmol/L (ref 22–32)
Calcium: 9.1 mg/dL (ref 8.9–10.3)
Chloride: 102 mmol/L (ref 98–111)
Creatinine, Ser: 0.78 mg/dL (ref 0.44–1.00)
GFR, Estimated: >60 mL/min (ref >60)
Glucose, Bld: 91 mg/dL (ref 70–99)
Potassium: 4.1 mmol/L (ref 3.5–5.1)
Sodium: 137 mmol/L (ref 135–145)

## 2024-07-11 LAB — PREGNANCY, URINE: Preg Test, Ur: NEGATIVE

## 2024-07-11 MED ORDER — NORETHINDRONE ACETATE 5 MG PO TABS: 5.0000 mg | ORAL_TABLET | Freq: Every day | ORAL | 0 refills | Status: DC

## 2024-07-11 NOTE — ED Notes (Signed)
 Jaclyn Jackson, US  Tech had a cancellation and is able to do her pelvic US  today at 4pm. Patient is agreeable to this and verbalized understanding.

## 2024-07-11 NOTE — ED Triage Notes (Signed)
 [employer] [phone].  Patient c/o pain over her left ovary and has had her period vaginal bleeding for 2 weeks.  Patient states that her period usually lasts only 7 days.   Patient denies N/V/D.  Patient reports history of ovarian cysts.  Patient denies any fevers.  Patient denies any urinary symptoms.

## 2024-07-11 NOTE — ED Provider Notes (Signed)
 [facility]    CSN: [id number] Arrival date & time: 07/11/24  1347      History   Chief Complaint Chief Complaint  Patient presents with   Abdominal Pain    Appointment     HPI HPI Jaclyn Jackson is a 43 y.o. female.   A Spanish interpreter was used for this encounter:  Name: Ami ID # [id number]   Raoul FORBES Grealish presents for left ovarian pain/pelvic with vaginal bleeding for the past 2 weeks. She called her gynecologist and has an appointment on 07/23/24 but she can no longer stand the pain.   Typically has regular periods that last about 7 days. Denies nausea, vomiting, belly pain. Last bowel movement was yesterday and it was normal.  Took nothing for pain prior to arrival. She is not currently pregnant and pelvic surgery 14 years ago.  Has history of ovarian cyst but it burst on its own. This was 14 years ago.      - Abnormal vaginal discharge: no  - vaginal odor: no - vaginal bleeding: yes  - Dysuria: no - Urinary urgency:no  - Urinary frequency: no  - Fever: no - Abdominal pain: no  - Pelvic pain: yes  - Rash/Skin lesions/mouth ulcers: no - Nausea: no  - Vomiting: no  - Back Pain: left lower back        Past Medical History:  Diagnosis Date   Headache    Osteoporosis    Ovarian cyst    Photophobia     Patient Active Problem List   Diagnosis Date Noted   Symptomatic anemia 01/10/2024    Past Surgical History:  Procedure Laterality Date   ABDOMINAL SURGERY     CESAREAN SECTION     COLONOSCOPY N/A 03/13/2024   Procedure: COLONOSCOPY;  Surgeon: Onita Elspeth Sharper, DO;  Location: [facility] ENDOSCOPY;  Service: Gastroenterology;  Laterality: N/A;   COLONOSCOPY N/A 04/24/2024   Procedure: COLONOSCOPY;  Surgeon: Onita Elspeth Sharper, DO;  Location: [facility] ENDOSCOPY;  Service: Gastroenterology;  Laterality: N/A;  SPANISH INTERPRETER   ESOPHAGOGASTRODUODENOSCOPY N/A 03/13/2024   Procedure: EGD (ESOPHAGOGASTRODUODENOSCOPY);  Surgeon: Onita Elspeth Sharper, DO;  [facility] ENDOSCOPY;  Service: Gastroenterology;  Laterality: N/A;   TUBAL LIGATION      OB History   No obstetric history on file.      Home Medications    Prior to Admission medications   Medication Sig Start Date End Date Taking? Authorizing Provider  norethindrone  (AYGESTIN ) 5 MG tablet Take 1 tablet (5 mg total) by mouth daily for 15 days. 07/11/24 07/26/24 Yes Jayla Mackie, DO  cyclobenzaprine  (FLEXERIL ) 10 MG tablet Take 1 tablet (10 mg total) by mouth at bedtime. 03/30/23   Teresa Shelba SAUNDERS, NP  gabapentin (NEURONTIN) 300 MG capsule Take 300 mg in the morning and  600 mg at night 03/05/23   [provider]  gabapentin (NEURONTIN) 600 MG tablet Take by mouth. 03/08/23 12/03/23  [provider]  linaclotide LARUE) 145 MCG CAPS capsule Take 145 mcg by mouth daily before breakfast. 02/13/24   [provider]  naproxen (NAPROSYN) 500 MG tablet Take 500 mg by mouth 2 (two) times daily as needed.    [provider]  nortriptyline (PAMELOR) 10 MG capsule Take 20 mg by mouth daily. Take 20 mg qam and 50 mg at bedtime 06/18/23   [provider]  Vitamin D, Ergocalciferol, (DRISDOL) 1.25 MG (50000 UNIT) CAPS capsule Take 50,000 Units by mouth every 7 (seven) days. 12/21/23  [provider]    Family History Family History  Problem Relation Age of Onset   Hypertension Mother    Heart disease Father    Colon cancer Maternal Aunt    Diabetes Maternal Uncle    Diabetes Maternal Grandmother    Breast cancer Neg Hx     Social History Social History   Tobacco Use   Smoking status: Never   Smokeless tobacco: Never  Vaping Use   Vaping status: Never Used  Substance Use Topics   Alcohol use: No   Drug use: Never     Allergies   Acetaminophen  and Hydrocodone -acetaminophen    Review of Systems Review of Systems: :negative unless otherwise stated in HPI.      Physical Exam Triage Vital Signs ED  Triage Vitals  Encounter Vitals Group     BP 07/11/24 1427 120/81     Girls Systolic BP Percentile --      Girls Diastolic BP Percentile --      Boys Systolic BP Percentile --      Boys Diastolic BP Percentile --      Pulse Rate 07/11/24 1427 72     Resp 07/11/24 1427 14     Temp 07/11/24 1427 98.4 F (36.9 C)     Temp Source 07/11/24 1427 Oral     SpO2 07/11/24 1427 100 %     Weight 07/11/24 1424 157 lb (71.2 kg)     Height 07/11/24 1424 5' 2 (1.575 m)     Head Circumference --      Peak Flow --      Pain Score 07/11/24 1424 10     Pain Loc --      Pain Education --      Exclude from Growth Chart --    No data found.  Updated Vital Signs BP 120/81 (BP Location: Left Arm)   Pulse 72   Temp 98.4 F (36.9 C) (Oral)   Resp 14   Ht 5' 2 (1.575 m)   Wt 71.2 kg   LMP 06/27/2024 (Exact Date)   SpO2 100%   BMI 28.72 kg/m   Visual Acuity Right Eye Distance:   Left Eye Distance:   Bilateral Distance:    Right Eye Near:   Left Eye Near:    Bilateral Near:     Physical Exam GEN: uncomfortable appearing female in no acute distress  CVS: well perfused, regular rate and rhythm  RESP: speaking in full sentences without pause, clear bilaterally  ABD: soft, moderate left lower abdominal TTP, non-distended, no palpable masses, no rebound, no guardian  GU: deferred pelvic exam  SKIN: warm, dry    UC Treatments / Results  Labs (all labs ordered are listed, but only abnormal results are displayed) Labs Reviewed  URINALYSIS, W/ REFLEX TO CULTURE (INFECTION SUSPECTED) - Abnormal; Notable for the following components:      Result Value   Color, Urine BROWN (*)    APPearance HAZY (*)    Specific Gravity, Urine >1.030 (*)    Hgb urine dipstick LARGE (*)    Bilirubin Urine SMALL (*)    Protein, ur 100 (*)    Bacteria, UA FEW (*)    All other components within normal limits  CBC WITH DIFFERENTIAL/PLATELET - Abnormal; Notable for the following components:   Hemoglobin 11.7  (*)    HCT 35.9 (*)    RDW 15.9 (*)    All other components within normal limits  BASIC METABOLIC PANEL WITH GFR -  Abnormal; Notable for the following components:   BUN 25 (*)    All other components within normal limits  PREGNANCY, URINE    EKG   Radiology US  PELVIC COMPLETE W TRANSVAGINAL AND TORSION R/O Result Date: 07/11/2024 CLINICAL DATA:  Left pelvic pain EXAM: TRANSABDOMINAL AND TRANSVAGINAL ULTRASOUND OF PELVIS DOPPLER ULTRASOUND OF OVARIES TECHNIQUE: Both transabdominal and transvaginal ultrasound examinations of the pelvis were performed. Transabdominal technique was performed for global imaging of the pelvis including uterus, ovaries, adnexal regions, and pelvic cul-de-sac. It was necessary to proceed with endovaginal exam following the transabdominal exam to visualize the uterus, endometrium, ovaries and adnexa. Color and duplex Doppler ultrasound was utilized to evaluate blood flow to the ovaries. COMPARISON:  01/25/2020 FINDINGS: Uterus Measurements: 11.0 x 5.5 x 7.2 cm = volume: 229 mL. 3.3 cm left fundal fibroid. Endometrium Thickness: 12 mm in thickness.  No focal abnormality visualized. Right ovary Measurements: Not visualized.  No adnexal mass seen. Left ovary Measurements: 2.6 x 2.6 x 3.5 cm = volume: 12 mL. 2.3 cm dominant follicle. No adnexal mass. Pulsed Doppler evaluation of the left ovary demonstrates normal low-resistance arterial and venous waveforms. Other findings No abnormal free fluid. IMPRESSION: No acute findings. No evidence of ovarian mass or torsion. Right ovary not visualized. 3.3 cm left fundal fibroid. Electronically Signed   By: Franky Crease M.D.   On: 07/11/2024 17:13    Procedures Procedures (including critical care time)  Medications Ordered in UC Medications - No data to display  Initial Impression / Assessment and Plan / UC Course  I have reviewed the triage vital signs and the nursing notes.  Pertinent labs & imaging results that were available  during my care of the patient were reviewed by me and considered in my medical decision making (see chart for details).       Patient is a 43 y.o. female  who has history of ovarian cysts presents for worsening left sided pelvic pain and abnormal uterine bleeding.   Previous pelvic ultrasound on January 25 2020 was unremarkable.   Overall patient is uncomfortable-appearing and afebrile.  Vital signs stable. Urine pregnancy test is negative.  Urinalysis not consistent with acute cystitis but does have some hematuria supported on microscopy.  She is currently menstruating. CBC showing normocytic anemia  without leukocytosis.  BMP is unremarkable.    Discussed ED evaluation for CT scan vs pelvic ultrasound. Pt's husband would like to avoid the ED.  The ultrasonographer fortunately was able to work patient in today.  Pelvic ultrasound today showed no right ovary (history of pelvic surgery).  No ovarian cyst or ovarian torsion. She has left fundal fibroid at 3.3 cm.  Patient was called and updated with results.   Regarding her abnormal uterine bleeding. She has no significant anemia. Start Aygestin  5 mg daily until she sees her OB-GYN.     Return and ED precautions including abdominal pain, fever, chills, nausea, or vomiting given. Follow-up,  if symptoms not improving or getting worse. Discussed MDM, treatment plan and plan for follow-up with patient who agrees with plan.    Time based billing statement Performed by: Caprice Porteous, DO    Total time: 67 minutes  Time was exclusive of separately billable procedures and treating other patients.   Time spent personally by me on the following activities: reviewing records, development of treatment plan with patient and/or surrogate, discussions with consultants/peer-to-peer conversations with insurance, evaluation of patient's response to treatment, examination of patient, obtaining history from patient or  surrogate, ordering and performing  treatments and interventions outside of separately billable procedures, ordering and review of laboratory studies, ordering and review of radiographic studies, pulse oximetry, coordinating care,  and re-evaluation of patient's condition.     Final Clinical Impressions(s) / UC Diagnoses   Final diagnoses:  Microcytic anemia  Pelvic pain in female  Abnormal uterine bleeding     Discharge Instructions      No est embarazada. No tiene una infeccin del tracto urinario. Probablemente tenga anemia debido a su sangrado vaginal anormal. Tome suplementos de hierro a diario. Acuda a su gineclogo segn lo programado. Tome noretindrona a diario hasta su cita con el gineclogo. Este medicamento le suspender la menstruacin.  La llamar si su ecografa plvica es anormal.  You are not pregnant. You do not have a urinary tract infection.  You are anemic most likely due to your abnormal vaginal bleeding. Take iron  supplements daily. Follow up with your gynecologist as schedule. Take norethindrone  daily until your see your gynecologist. This medication will stop your period.   I will call you if your pelvic ultrasound is abnormal.       ED Prescriptions     Medication Sig Dispense Auth. Provider   norethindrone  (AYGESTIN ) 5 MG tablet Take 1 tablet (5 mg total) by mouth daily for 15 days. 15 tablet Kriste Berth, DO      PDMP not reviewed this encounter.   Aaliayah Miao, DO 07/12/24 [phone]

## 2024-07-11 NOTE — Discharge Instructions (Addendum)
 No est embarazada. No tiene una infeccin del tracto urinario. Probablemente tenga anemia debido a su sangrado vaginal anormal. Tome suplementos de hierro a diario. Acuda a su gineclogo segn lo programado. Tome noretindrona a diario hasta su cita con el gineclogo. Este medicamento le suspender la menstruacin.  La llamar si su ecografa plvica es anormal.  You are not pregnant. You do not have a urinary tract infection.  You are anemic most likely due to your abnormal vaginal bleeding. Take iron  supplements daily. Follow up with your gynecologist as schedule. Take norethindrone  daily until your see your gynecologist. This medication will stop your period.   I will call you if your pelvic ultrasound is abnormal.

## 2024-07-14 ENCOUNTER — Ambulatory Visit

## 2024-07-16 ENCOUNTER — Inpatient Hospital Stay: Attending: Internal Medicine

## 2024-07-16 ENCOUNTER — Inpatient Hospital Stay

## 2024-07-16 ENCOUNTER — Encounter: Payer: Self-pay | Admitting: Internal Medicine

## 2024-07-16 ENCOUNTER — Inpatient Hospital Stay (HOSPITAL_BASED_OUTPATIENT_CLINIC_OR_DEPARTMENT_OTHER): Admitting: Internal Medicine

## 2024-07-16 VITALS — BP 116/73 | HR 78

## 2024-07-16 VITALS — BP 114/77 | HR 77 | Temp 98.5°F | Resp 16 | Ht 62.0 in | Wt 162.2 lb

## 2024-07-16 DIAGNOSIS — K5909 Other constipation: Secondary | ICD-10-CM | POA: Diagnosis not present

## 2024-07-16 DIAGNOSIS — D509 Iron deficiency anemia, unspecified: Secondary | ICD-10-CM | POA: Diagnosis present

## 2024-07-16 DIAGNOSIS — D649 Anemia, unspecified: Secondary | ICD-10-CM | POA: Diagnosis not present

## 2024-07-16 LAB — IRON AND TIBC
Iron: 47 ug/dL (ref 28–170)
Saturation Ratios: 12 % (ref 10.4–31.8)
TIBC: 402 ug/dL (ref 250–450)
UIBC: 355 ug/dL

## 2024-07-16 LAB — BASIC METABOLIC PANEL - CANCER CENTER ONLY
Anion gap: 8 (ref 5–15)
BUN: 18 mg/dL (ref 6–20)
CO2: 25 mmol/L (ref 22–32)
Calcium: 9 mg/dL (ref 8.9–10.3)
Chloride: 104 mmol/L (ref 98–111)
Creatinine: 0.82 mg/dL (ref 0.44–1.00)
GFR, Estimated: >60 mL/min (ref >60)
Glucose, Bld: 88 mg/dL (ref 70–99)
Potassium: 3.8 mmol/L (ref 3.5–5.1)
Sodium: 137 mmol/L (ref 135–145)

## 2024-07-16 LAB — CBC WITH DIFFERENTIAL (CANCER CENTER ONLY)
Abs Immature Granulocytes: 0.01 K/uL (ref 0.00–0.07)
Basophils Absolute: 0 K/uL (ref 0.0–0.1)
Basophils Relative: 0 %
Eosinophils Absolute: 0.1 K/uL (ref 0.0–0.5)
Eosinophils Relative: 3 %
HCT: 36.5 % (ref 36.0–46.0)
Hemoglobin: 11.9 g/dL — ABNORMAL LOW (ref 12.0–15.0)
Immature Granulocytes: 0 %
Lymphocytes Relative: 24 %
Lymphs Abs: 1 K/uL (ref 0.7–4.0)
MCH: 27.2 pg (ref 26.0–34.0)
MCHC: 32.6 g/dL (ref 30.0–36.0)
MCV: 83.3 fL (ref 80.0–100.0)
Monocytes Absolute: 0.4 K/uL (ref 0.1–1.0)
Monocytes Relative: 9 %
Neutro Abs: 2.5 K/uL (ref 1.7–7.7)
Neutrophils Relative %: 64 %
Platelet Count: 156 K/uL (ref 150–400)
RBC: 4.38 MIL/uL (ref 3.87–5.11)
RDW: 15.7 % — ABNORMAL HIGH (ref 11.5–15.5)
WBC Count: 4 K/uL (ref 4.0–10.5)
nRBC: 0 % (ref 0.0–0.2)

## 2024-07-16 LAB — FERRITIN: Ferritin: 7 ng/mL — ABNORMAL LOW (ref 11–307)

## 2024-07-16 MED ORDER — SODIUM CHLORIDE 0.9% FLUSH
10.0000 mL | Freq: Once | INTRAVENOUS | Status: AC | PRN
  Administered 2024-07-16: 10 mL
  Filled 2024-07-16: qty 10

## 2024-07-16 MED ORDER — IRON SUCROSE 20 MG/ML IV SOLN
200.0000 mg | Freq: Once | INTRAVENOUS | Status: AC
  Administered 2024-07-16: 200 mg via INTRAVENOUS
  Filled 2024-07-16: qty 10

## 2024-07-16 NOTE — Patient Instructions (Signed)

## 2024-07-16 NOTE — Progress Notes (Signed)
 Fatigue/weakness: NO Dyspena: NO  Light headedness: NO Blood in stool: NO  Seen in [facility] for bleeding from a fibroid for the past 3 weeks. Placed on norethindrone .

## 2024-07-16 NOTE — Addendum Note (Signed)
 Addended by: LAEL BROWNING A on: 07/16/2024 01:37 PM   Modules accepted: Orders

## 2024-07-16 NOTE — Progress Notes (Signed)
 Patient tolerated Venofer  infusion well. Explained recommendation of 30 min post monitoring. Patient refused to wait post monitoring. Educated on what signs to watch for & to call with any concerns. No questions, discharged. Stable

## 2024-07-16 NOTE — Progress Notes (Signed)
  [facility] CONSULT NOTE  Patient Care Team: Revelo, Yancy Roof, MD as PCP - General (Family Medicine) Darliss Rogue, MD as PCP - Cardiology (Cardiology) Rennie Cindy SAUNDERS, MD as Consulting Physician (Oncology)  CHIEF COMPLAINTS/PURPOSE OF CONSULTATION: ANEMIA   HEMATOLOGY HISTORY  # ANEMIA[Hb; MCV-platelets- WBC; Iron  sat; ferritin;  GFR- CT/US - ;   HISTORY OF PRESENTING ILLNESS:  Patient ambulating-independently. Alone.  Jaclyn Jackson 43 y.o.  female pleasant patient iron  deficiency anemia likely secondary to menstrual cycles here for follow-up.  Patient's status post evaluation in ER -status post ultrasound currently on hormone therapy. Awaiting gyn evaluation.   Patient admit to improvement of shortness of breath with exertion, and excessive fatigue.    Review of Systems  Constitutional:  Positive for malaise/fatigue. Negative for chills, diaphoresis, fever and weight loss.  HENT:  Negative for nosebleeds and sore throat.   Eyes:  Negative for double vision.  Respiratory:  Negative for cough, hemoptysis, sputum production and wheezing.   Cardiovascular:  Negative for chest pain, palpitations, orthopnea and leg swelling.  Gastrointestinal:  Negative for abdominal pain, blood in stool, constipation, diarrhea, heartburn, melena, nausea and vomiting.  Genitourinary:  Negative for dysuria, frequency and urgency.  Musculoskeletal:  Negative for back pain and joint pain.  Skin: Negative.  Negative for itching and rash.  Neurological:  Negative for dizziness, tingling, focal weakness, weakness and headaches.  Endo/Heme/Allergies:  Does not bruise/bleed easily.  Psychiatric/Behavioral:  Negative for depression. The patient is not nervous/anxious and does not have insomnia.    MEDICAL HISTORY:  Past Medical History:  Diagnosis Date   Headache    Osteoporosis    Ovarian cyst    Photophobia     SURGICAL HISTORY: Past Surgical History:  Procedure  Laterality Date   ABDOMINAL SURGERY     CESAREAN SECTION     COLONOSCOPY N/A 03/13/2024   Procedure: COLONOSCOPY;  Surgeon: Onita Elspeth Sharper, DO;  Location[facility] ENDOSCOPY;  Service: Gastroenterology;  Laterality: N/A;   COLONOSCOPY N/A 04/24/2024   Procedure: COLONOSCOPY;  Surgeon: Onita Elspeth Sharper, DO;  Location: [facility] ENDOSCOPY;  Service: Gastroenterology;  Laterality: N/A;  SPANISH INTERPRETER   ESOPHAGOGASTRODUODENOSCOPY N/A 03/13/2024   Procedure: EGD (ESOPHAGOGASTRODUODENOSCOPY);  Surgeon: Onita Elspeth Sharper, DO;  Location: [facility] ENDOSCOPY;  Service: Gastroenterology;  Laterality: N/A;   TUBAL LIGATION      SOCIAL HISTORY: Social History   Socioeconomic History   Marital status: Married    Spouse name: Not on file   Number of children: Not on file   Years of education: Not on file   Highest education level: Not on file  Occupational History   Not on file  Tobacco Use   Smoking status: Never   Smokeless tobacco: Never  Vaping Use   Vaping status: Never Used  Substance and Sexual Activity   Alcohol use: No   Drug use: Never   Sexual activity: Not on file  Other Topics Concern   Not on file  Social History Narrative   Family: mat great aunt- colon cancer in 50 ys.. [place] No smoking or alcohol. [employer] aware house.    Social Drivers of Corporate investment banker Strain: Low Risk  (05/21/2024)   Received from [facility]   Overall Financial Resource Strain (CARDIA)    Difficulty of Paying Living Expenses: Not hard at all  Food Insecurity: No Food Insecurity (05/21/2024)   Received from [facility]   Hunger Vital Sign  Within the past 12 months, you worried that your food would run out before you got the money to buy more.: Never true    Within the past 12 months, the food you bought just didn't last and you didn't have money to get more.: Never true  Transportation Needs: No Transportation Needs (05/21/2024)   Received  from [facility] - Transportation    In the past 12 months, has lack of transportation kept you from medical appointments or from getting medications?: No    Lack of Transportation (Non-Medical): No  Physical Activity: Not on file  Stress: Not on file  Social Connections: Not on file  Intimate Partner Violence: Not At Risk (01/10/2024)   Humiliation, Afraid, Rape, and Kick questionnaire    Fear of Current or Ex-Partner: No    Emotionally Abused: No    Physically Abused: No    Sexually Abused: No    FAMILY HISTORY: Family History  Problem Relation Age of Onset   Hypertension Mother    Heart disease Father    Colon cancer Maternal Aunt    Diabetes Maternal Uncle    Diabetes Maternal Grandmother    Breast cancer Neg Hx     ALLERGIES:  is allergic to acetaminophen  and hydrocodone -acetaminophen .  MEDICATIONS:  Current Outpatient Medications  Medication Sig Dispense Refill   cyclobenzaprine  (FLEXERIL ) 10 MG tablet Take 1 tablet (10 mg total) by mouth at bedtime. 10 tablet 0   gabapentin (NEURONTIN) 300 MG capsule Take 300 mg in the morning and  600 mg at night     linaclotide (LINZESS) 145 MCG CAPS capsule Take 145 mcg by mouth daily before breakfast.     naproxen (NAPROSYN) 500 MG tablet Take 500 mg by mouth 2 (two) times daily as needed.     norethindrone  (AYGESTIN ) 5 MG tablet Take 1 tablet (5 mg total) by mouth daily for 15 days. 15 tablet 0   nortriptyline (PAMELOR) 10 MG capsule Take 20 mg by mouth daily. Take 20 mg qam and 50 mg at bedtime     Vitamin D, Ergocalciferol, (DRISDOL) 1.25 MG (50000 UNIT) CAPS capsule Take 50,000 Units by mouth every 7 (seven) days.     No current facility-administered medications for this visit.     PHYSICAL EXAMINATION:   Vitals:   07/16/24 1240  BP: 114/77  Pulse: 77  Resp: 16  Temp: 98.5 F (36.9 C)  SpO2: 100%   Filed Weights   07/16/24 1240  Weight: 162 lb 3.2 oz (73.6 kg)    Physical  Exam Vitals and nursing note reviewed.  HENT:     Head: Normocephalic and atraumatic.     Mouth/Throat:     Pharynx: Oropharynx is clear.  Eyes:     Extraocular Movements: Extraocular movements intact.     Pupils: Pupils are equal, round, and reactive to light.  Cardiovascular:     Rate and Rhythm: Normal rate and regular rhythm.  Pulmonary:     Comments: Decreased breath sounds bilaterally.  Abdominal:     Palpations: Abdomen is soft.  Musculoskeletal:        General: Normal range of motion.     Cervical back: Normal range of motion.  Skin:    General: Skin is warm.  Neurological:     General: No focal deficit present.     Mental Status: She is alert and oriented to person, place, and time.  Psychiatric:        Behavior: Behavior normal.  Judgment: Judgment normal.      LABORATORY DATA:  I have reviewed the data as listed Lab Results  Component Value Date   WBC 4.0 07/16/2024   HGB 11.9 (L) 07/16/2024   HCT 36.5 07/16/2024   MCV 83.3 07/16/2024   PLT 156 07/16/2024   Recent Labs    01/10/24 1158 03/12/24 0917 07/11/24 1528 07/16/24 1233  NA 138 136 137 137  K 3.6 3.9 4.1 3.8  CL 105 103 102 104  CO2 25 25 27 25   GLUCOSE 99 94 91 88  BUN 19 19 25* 18  CREATININE 0.84 0.89 0.78 0.82  CALCIUM 9.1 8.8* 9.1 9.0  GFRNONAA >60 >60 >60 >60  PROT 7.6  --   --   --   ALBUMIN 4.1  --   --   --   AST 27  --   --   --   ALT 23  --   --   --   ALKPHOS 60  --   --   --   BILITOT 0.5  --   --   --      US  PELVIC COMPLETE W TRANSVAGINAL AND TORSION R/O Result Date: 07/11/2024 CLINICAL DATA:  Left pelvic pain EXAM: TRANSABDOMINAL AND TRANSVAGINAL ULTRASOUND OF PELVIS DOPPLER ULTRASOUND OF OVARIES TECHNIQUE: Both transabdominal and transvaginal ultrasound examinations of the pelvis were performed. Transabdominal technique was performed for global imaging of the pelvis including uterus, ovaries, adnexal regions, and pelvic cul-de-sac. It was necessary to proceed with  endovaginal exam following the transabdominal exam to visualize the uterus, endometrium, ovaries and adnexa. Color and duplex Doppler ultrasound was utilized to evaluate blood flow to the ovaries. COMPARISON:  01/25/2020 FINDINGS: Uterus Measurements: 11.0 x 5.5 x 7.2 cm = volume: 229 mL. 3.3 cm left fundal fibroid. Endometrium Thickness: 12 mm in thickness.  No focal abnormality visualized. Right ovary Measurements: Not visualized.  No adnexal mass seen. Left ovary Measurements: 2.6 x 2.6 x 3.5 cm = volume: 12 mL. 2.3 cm dominant follicle. No adnexal mass. Pulsed Doppler evaluation of the left ovary demonstrates normal low-resistance arterial and venous waveforms. Other findings No abnormal free fluid. IMPRESSION: No acute findings. No evidence of ovarian mass or torsion. Right ovary not visualized. 3.3 cm left fundal fibroid. Electronically Signed   By: Franky Crease M.D.   On: 07/11/2024 17:13   DG INJECT DIAG/THERA/INC NEEDLE/CATH/PLC EPI/CERV/THOR W/IMG Result Date: 06/23/2024 CLINICAL DATA:  Cervical spondylosis without myelopathy. Pain in the neck, shoulders, and arms, greater on the left. Disc protrusions at C4-5 and C5-6. FLUOROSCOPY: Radiation Exposure Index (as provided by the fluoroscopic device): 1.0 mGy Kerma PROCEDURE: The procedure, risks, benefits, and alternatives were explained to the patient. Questions regarding the procedure were encouraged and answered. The patient understands and consents to the procedure. CERVICAL EPIDURAL INJECTION An interlaminar approach was performed on the left at C7-T1. A 3.5 inch 20 gauge epidural needle was advanced using loss-of-resistance technique. DIAGNOSTIC EPIDURAL INJECTION Injection of Isovue -M 300 shows a good epidural pattern with spread above and below the level of needle placement primarily on the left. No vascular opacification is seen. THERAPEUTIC EPIDURAL INJECTION 60 mg of Kenalog  mixed with 2 mL of normal saline were then instilled. The procedure  was well-tolerated, and the patient was discharged 20 minutes following the injection in good condition. IMPRESSION: Technically successful interlaminar epidural injection on the left at C7-T1. Electronically Signed   By: Dasie Hamburg M.D.   On: 06/23/2024 12:55   MM 3D  DIAGNOSTIC MAMMOGRAM UNILATERAL LEFT BREAST Result Date: 06/17/2024 CLINICAL DATA:  Screening recall LEFT breast asymmetries (2 marked in CC, 1 marked in MLO) EXAM: DIGITAL DIAGNOSTIC UNILATERAL LEFT MAMMOGRAM WITH TOMOSYNTHESIS AND CAD; ULTRASOUND LEFT BREAST LIMITED TECHNIQUE: Left digital diagnostic mammography and breast tomosynthesis was performed. The images were evaluated with computer-aided detection. ; Targeted ultrasound examination of the left breast was performed. COMPARISON:  Previous exam(s). ACR Breast Density Category d: The breasts are extremely dense, which lowers the sensitivity of mammography. FINDINGS: Questioned asymmetry in the LATERAL breast on the LEFT CC view, as well as questioned asymmetry on the LEFT MLO view, resolve with additional imaging, compatible with superimposition of normal breast tissue. Ultrasound of the LATERAL LEFT breast was performed as a precaution due to the presence of dense breast tissue. Spot compression views of the MEDIAL LEFT breast confirm the presence of an oval circumscribed mass measuring approximately 13 mm at the approximate 10 o'clock position posterior depth, partially visualized on the full field LATERAL tomo. Ultrasound was performed. Targeted ultrasound of the LEFT breast at the 10 o'clock position 8 cm from the nipple demonstrates an oval circumscribed hypoechoic mass measuring 12 x 11 x 8 mm. This correlates with the MEDIAL mammographic finding. At the LEFT breast 3 o'clock position 3 and 4 cm from the nipple are 2 adjacent oval circumscribed hypoechoic masses versus cysts, the larger measuring up to 6 mm in diameter. The LATERAL LEFT breast was further evaluated in the region of  resolved asymmetry, without suspicious sonographic abnormality. Representative negative pictures were taken. LEFT axillary ultrasound was within normal limits. IMPRESSION: 1. Probably benign LEFT breast mass at the 10 o'clock position, correlating with a mass identified at the time of screening and presumably reflecting a benign fibroadenoma. A six-month follow-up LEFT breast ultrasound is recommended. 2. Incidentally noted complicated cysts versus small circumscribed masses at 3 o'clock, 3 and 4 cm from the nipple in the LEFT breast. Evaluation of these findings is recommended at the time of the above ultrasound. 3. Additional LEFT breast asymmetries resolve with additional imaging, compatible with superimposition of normal breast tissue, without suspicious sonographic abnormality in the region of resolved asymmetry. RECOMMENDATION: Six-month follow-up ultrasound of the LEFT breast for the evaluation abnormalities at 10 and 3 o'clock. I have discussed the findings and recommendations with the patient. If applicable, a reminder letter will be sent to the patient regarding the next appointment. BI-RADS CATEGORY  3: Probably benign. Electronically Signed   By: Norleen Croak M.D.   On: 06/17/2024 15:41   US  LIMITED ULTRASOUND INCLUDING AXILLA LEFT BREAST  Result Date: 06/17/2024 CLINICAL DATA:  Screening recall LEFT breast asymmetries (2 marked in CC, 1 marked in MLO) EXAM: DIGITAL DIAGNOSTIC UNILATERAL LEFT MAMMOGRAM WITH TOMOSYNTHESIS AND CAD; ULTRASOUND LEFT BREAST LIMITED TECHNIQUE: Left digital diagnostic mammography and breast tomosynthesis was performed. The images were evaluated with computer-aided detection. ; Targeted ultrasound examination of the left breast was performed. COMPARISON:  Previous exam(s). ACR Breast Density Category d: The breasts are extremely dense, which lowers the sensitivity of mammography. FINDINGS: Questioned asymmetry in the LATERAL breast on the LEFT CC view, as well as questioned  asymmetry on the LEFT MLO view, resolve with additional imaging, compatible with superimposition of normal breast tissue. Ultrasound of the LATERAL LEFT breast was performed as a precaution due to the presence of dense breast tissue. Spot compression views of the MEDIAL LEFT breast confirm the presence of an oval circumscribed mass measuring approximately 13 mm at the approximate  10 o'clock position posterior depth, partially visualized on the full field LATERAL tomo. Ultrasound was performed. Targeted ultrasound of the LEFT breast at the 10 o'clock position 8 cm from the nipple demonstrates an oval circumscribed hypoechoic mass measuring 12 x 11 x 8 mm. This correlates with the MEDIAL mammographic finding. At the LEFT breast 3 o'clock position 3 and 4 cm from the nipple are 2 adjacent oval circumscribed hypoechoic masses versus cysts, the larger measuring up to 6 mm in diameter. The LATERAL LEFT breast was further evaluated in the region of resolved asymmetry, without suspicious sonographic abnormality. Representative negative pictures were taken. LEFT axillary ultrasound was within normal limits. IMPRESSION: 1. Probably benign LEFT breast mass at the 10 o'clock position, correlating with a mass identified at the time of screening and presumably reflecting a benign fibroadenoma. A six-month follow-up LEFT breast ultrasound is recommended. 2. Incidentally noted complicated cysts versus small circumscribed masses at 3 o'clock, 3 and 4 cm from the nipple in the LEFT breast. Evaluation of these findings is recommended at the time of the above ultrasound. 3. Additional LEFT breast asymmetries resolve with additional imaging, compatible with superimposition of normal breast tissue, without suspicious sonographic abnormality in the region of resolved asymmetry. RECOMMENDATION: Six-month follow-up ultrasound of the LEFT breast for the evaluation abnormalities at 10 and 3 o'clock. I have discussed the findings and  recommendations with the patient. If applicable, a reminder letter will be sent to the patient regarding the next appointment. BI-RADS CATEGORY  3: Probably benign. Electronically Signed   By: Norleen Croak M.D.   On: 06/17/2024 15:41     ASSESSMENT & PLAN:   Symptomatic anemia # Anemia- Hb-symptomatic. From  iron  deficiency [march 2025- ferritin-5] - from etiology ?GI blood loss/menorrhagia. Not on PO iron  sec to constipation.    S/p  IV iron  infusion/Venofer -improved- but  proceed with venofer .   #Etiology of iron  deficiency: possible menstrual blood; loss- Hx  EGD-9 years [[facility]] /colonoscopy- s/p GI evaluation-  colonoscopy- 5/08-. HOLD off  capsule study/ CT scan abdomen pelvis for now. Awaiting evaluation with gynecology-status post ultrasound [ER] currently on hormone therapy  #Chronic constipation on linzess     urine pregnancy test- Tubal ligation # DISPOSITION: # venofer  today- ok without urin pregnancy test-  # follow up  4 months-  MD; labs- cbc/bmp;iron  studies; ferritin;; possible venofer - Dr.B  All questions were answered. The patient knows to call the clinic with any problems, questions or concerns.   Cindy JONELLE Joe, MD 07/16/2024 1:29 PM

## 2024-07-16 NOTE — Assessment & Plan Note (Addendum)
#   Anemia- Hb-symptomatic. From  iron  deficiency [march 2025- ferritin-5] - from etiology ?GI blood loss/menorrhagia. Not on PO iron  sec to constipation.    S/p  IV iron  infusion/Venofer -improved- but  proceed with venofer .   #Etiology of iron  deficiency: possible menstrual blood; loss- Hx  EGD-9 years [[facility]] /colonoscopy- s/p GI evaluation-  colonoscopy- 5/08-. HOLD off  capsule study/ CT scan abdomen pelvis for now. Awaiting evaluation with gynecology-status post ultrasound [ER] currently on hormone therapy  #Chronic constipation on linzess     urine pregnancy test- Tubal ligation # DISPOSITION: # venofer  today- ok without urin pregnancy test-  # follow up  4 months-  MD; labs- cbc/bmp;iron  studies; ferritin;; possible venofer - Dr.B

## 2024-07-16 NOTE — Progress Notes (Signed)
 Per MD # venofer  today- ok without urin pregnancy test

## 2024-07-18 ENCOUNTER — Encounter: Payer: Self-pay | Admitting: Obstetrics

## 2024-07-18 NOTE — Progress Notes (Signed)
 GYNECOLOGY PROGRESS NOTE  Subjective:  PCP: Ernie Yancy Roof, MD  Patient ID: Jaclyn Jackson, female    DOB: 29-Sep-1981, 43 y.o.   MRN: [id number]  This encounter was facilitated by live Spanish language interpreter.  HPI  Patient is a 43 y.o. [id number] female who presents for fibroids.  Patient referred by Noretta Cave AGNP.  Patient has been experiencing pelvic pain and vaginal bleeding for several weeks.  TVUS done in ED 07/11/24 showed 3.3cm left fundal fibroid.  Patient was started on Aygestin  5mg  at that time.  Her heavy bleeding has not changed on the Aygestin . Her periods are normally regular monthly, but very heavy. She reports hx of anemia due to HMB and received iron  transfusions. She expresses desire for hysterectomy.  Gynecology Hx: Last pap: pt states within the past 68yrs and normal, records not available Abnormal pap: denies Last mammogram: 06/17/24, normal STI Hx: denies Contraception: s/p BTL  OB History  Gravida Para Term Preterm AB Living  3 3 3   3   SAB IAB Ectopic Multiple Live Births          # Outcome Date GA Lbr Len/2nd Weight Sex Type Anes PTL Lv  3 Term           2 Term           1 Term            Past Medical History:  Diagnosis Date   Headache    Osteoporosis    Ovarian cyst    Photophobia    Patient Active Problem List   Diagnosis Date Noted   Symptomatic anemia 01/10/2024   Past Surgical History:  Procedure Laterality Date   ABDOMINAL SURGERY     CESAREAN SECTION     COLONOSCOPY N/A 03/13/2024   Procedure: COLONOSCOPY;  Surgeon: Onita Elspeth Sharper, DO;  Location: [facility] ENDOSCOPY;  Service: Gastroenterology;  Laterality: N/A;   COLONOSCOPY N/A 04/24/2024   Procedure: COLONOSCOPY;  Surgeon: Onita Elspeth Sharper, DO;  Location: [facility] ENDOSCOPY;  Service: Gastroenterology;  Laterality: N/A;  SPANISH INTERPRETER   ESOPHAGOGASTRODUODENOSCOPY N/A 03/13/2024   Procedure: EGD (ESOPHAGOGASTRODUODENOSCOPY);  Surgeon: Onita Elspeth Sharper,  DO;  Location: [facility] ENDOSCOPY;  Service: Gastroenterology;  Laterality: N/A;   TUBAL LIGATION     Family History  Problem Relation Age of Onset   Hypertension Mother    Heart disease Father    Colon cancer Maternal Aunt    Diabetes Maternal Uncle    Diabetes Maternal Grandmother    Breast cancer Neg Hx    Social History   Socioeconomic History   Marital status: Married    Spouse name: Not on file   Number of children: Not on file   Years of education: Not on file   Highest education level: Not on file  Occupational History   Not on file  Tobacco Use   Smoking status: Never   Smokeless tobacco: Never  Vaping Use   Vaping status: Never Used  Substance and Sexual Activity   Alcohol use: No   Drug use: Never   Sexual activity: Not on file  Other Topics Concern   Not on file  Social History Narrative   Family: mat great aunt- colon cancer in 74 ys.. [place] No smoking or alcohol. [employer] aware house.    Social Drivers of Corporate investment banker Strain: Low Risk  (05/21/2024)   Received from [facility]   Overall Financial Resource Strain (CARDIA)  Difficulty of Paying Living Expenses: Not hard at all  Food Insecurity: No Food Insecurity (05/21/2024)   Received from [facility]   Hunger Vital Sign    Within the past 12 months, you worried that your food would run out before you got the money to buy more.: Never true    Within the past 12 months, the food you bought just didn't last and you didn't have money to get more.: Never true  Transportation Needs: No Transportation Needs (05/21/2024)   Received from [facility] - Transportation    In the past 12 months, has lack of transportation kept you from medical appointments or from getting medications?: No    Lack of Transportation (Non-Medical): No  Physical Activity: Not on file  Stress: Not on file  Social Connections: Not on file  Intimate Partner  Violence: Not At Risk (01/10/2024)   Humiliation, Afraid, Rape, and Kick questionnaire    Fear of Current or Ex-Partner: No    Emotionally Abused: No    Physically Abused: No    Sexually Abused: No   Current Outpatient Medications on File Prior to Visit  Medication Sig Dispense Refill   cyclobenzaprine  (FLEXERIL ) 10 MG tablet Take 1 tablet (10 mg total) by mouth at bedtime. 10 tablet 0   gabapentin (NEURONTIN) 300 MG capsule Take 300 mg in the morning and  600 mg at night     linaclotide (LINZESS) 145 MCG CAPS capsule Take 145 mcg by mouth daily before breakfast.     naproxen (NAPROSYN) 500 MG tablet Take 500 mg by mouth 2 (two) times daily as needed.     nortriptyline (PAMELOR) 10 MG capsule Take 20 mg by mouth daily. Take 20 mg qam and 50 mg at bedtime     Vitamin D, Ergocalciferol, (DRISDOL) 1.25 MG (50000 UNIT) CAPS capsule Take 50,000 Units by mouth every 7 (seven) days.     No current facility-administered medications on file prior to visit.   Allergies  Allergen Reactions   Acetaminophen  Itching   Hydrocodone -Acetaminophen  Itching    Review of Systems Pertinent items are noted in HPI.   Objective:   Blood pressure 119/80, pulse 71, height 5' 2 (1.575 m), weight 161 lb 3.2 oz (73.1 kg), last menstrual period 06/27/2024. Body mass index is 29.48 kg/m.  General appearance: alert, cooperative, and no distress Abdomen: soft, non-tender; bowel sounds normal; no masses,  no organomegaly Pelvic: cervix normal in appearance, external genitalia normal, no adnexal masses or tenderness, no cervical motion tenderness, positive findings: uterine enlargement, rectovaginal septum normal, and vagina normal without discharge Extremities: extremities normal, atraumatic, no cyanosis or edema Neurologic: Grossly normal  Ultrasound 07/11/24 FINDINGS: Uterus Measurements: 11.0 x 5.5 x 7.2 cm = volume: 229 mL. 3.3 cm left fundal fibroid.   Endometrium Thickness: 12 mm in thickness.  No focal  abnormality visualized.   Right ovary Measurements: Not visualized.  No adnexal mass seen.   Left ovary Measurements: 2.6 x 2.6 x 3.5 cm = volume: 12 mL. 2.3 cm dominant follicle. No adnexal mass.   Pulsed Doppler evaluation of the left ovary demonstrates normal low-resistance arterial and venous waveforms.   Other findings No abnormal free fluid.   IMPRESSION: No acute findings. No evidence of ovarian mass or torsion. Right ovary not visualized.   3.3 cm left fundal fibroid.   Assessment/Plan:   1. Uterine leiomyoma, unspecified location   2. Abnormal uterine bleeding (AUB)   3. Encounter for immunization  43 y.o. [id number] with AUB-L which started about 2 mos ago and not responding to Aygestin , here today expressing desire for hysterectomy. Discussed management options for abnormal uterine bleeding including expectant, NSAIDs, tranexamic acid (Lysteda), oral progesterone (Provera , norethindrone , megace), Depo Provera , Levonorgestrel containing IUD, endometrial ablation (Novasure) or hysterectomy as definitive surgical management.  Discussed risks and benefits of each method.  Pt does want hysterectomy after discussion. Referral placed to [facility] for surgical consult.  -DC Aygestin  and start Provera  BID for bleeding until seen for surgical consult. ED precautions given.    Estil Mangle, DO [facility]

## 2024-07-23 ENCOUNTER — Ambulatory Visit (INDEPENDENT_AMBULATORY_CARE_PROVIDER_SITE_OTHER): Admitting: Obstetrics

## 2024-07-23 ENCOUNTER — Encounter: Payer: Self-pay | Admitting: Obstetrics

## 2024-07-23 VITALS — BP 119/80 | HR 71 | Ht 62.0 in | Wt 161.2 lb

## 2024-07-23 DIAGNOSIS — D219 Benign neoplasm of connective and other soft tissue, unspecified: Secondary | ICD-10-CM

## 2024-07-23 DIAGNOSIS — D259 Leiomyoma of uterus, unspecified: Secondary | ICD-10-CM | POA: Diagnosis not present

## 2024-07-23 DIAGNOSIS — N939 Abnormal uterine and vaginal bleeding, unspecified: Secondary | ICD-10-CM

## 2024-07-23 DIAGNOSIS — Z23 Encounter for immunization: Secondary | ICD-10-CM

## 2024-07-23 MED ORDER — MEDROXYPROGESTERONE ACETATE 10 MG PO TABS: 10.0000 mg | ORAL_TABLET | Freq: Two times a day (BID) | ORAL | 1 refills | Status: AC

## 2024-07-28 ENCOUNTER — Ambulatory Visit
Admission: RE | Admit: 2024-07-28 | Discharge: 2024-07-28 | Disposition: A | Discharge status: Home / Self Care | Source: Ambulatory Visit | Attending: Physical Medicine & Rehabilitation | Admitting: Physical Medicine & Rehabilitation

## 2024-07-28 DIAGNOSIS — M5412 Radiculopathy, cervical region: Secondary | ICD-10-CM

## 2024-07-28 MED ORDER — IOPAMIDOL (ISOVUE-M 300) INJECTION 61%
1.0000 mL | Freq: Once | INTRAMUSCULAR | Status: AC
  Administered 2024-07-28: 1 mL via EPIDURAL

## 2024-07-28 MED ORDER — TRIAMCINOLONE ACETONIDE 40 MG/ML IJ SUSP (RADIOLOGY)
60.0000 mg | Freq: Once | INTRAMUSCULAR | Status: AC
  Administered 2024-07-28: 60 mg via EPIDURAL

## 2024-07-28 NOTE — Discharge Instructions (Signed)

## 2024-08-26 ENCOUNTER — Other Ambulatory Visit (HOSPITAL_COMMUNITY)
Admission: RE | Admit: 2024-08-26 | Discharge: 2024-08-26 | Disposition: A | Discharge status: Home / Self Care | Source: Ambulatory Visit | Attending: Obstetrics and Gynecology | Admitting: Obstetrics and Gynecology

## 2024-08-26 ENCOUNTER — Encounter: Payer: Self-pay | Admitting: Obstetrics and Gynecology

## 2024-08-26 ENCOUNTER — Ambulatory Visit: Admitting: Obstetrics and Gynecology

## 2024-08-26 VITALS — BP 125/83 | HR 76 | Ht 62.0 in | Wt 166.0 lb

## 2024-08-26 DIAGNOSIS — R102 Pelvic and perineal pain unspecified side: Secondary | ICD-10-CM | POA: Insufficient documentation

## 2024-08-26 DIAGNOSIS — Z124 Encounter for screening for malignant neoplasm of cervix: Secondary | ICD-10-CM | POA: Insufficient documentation

## 2024-08-26 DIAGNOSIS — N939 Abnormal uterine and vaginal bleeding, unspecified: Secondary | ICD-10-CM

## 2024-08-26 MED ORDER — CEFTRIAXONE SODIUM 500 MG IJ SOLR
500.0000 mg | Freq: Once | INTRAMUSCULAR | Status: AC
  Administered 2024-08-26: 500 mg via INTRAMUSCULAR

## 2024-08-26 MED ORDER — DOXYCYCLINE HYCLATE 100 MG PO CAPS: 100.0000 mg | ORAL_CAPSULE | Freq: Two times a day (BID) | ORAL | 0 refills | Status: AC

## 2024-08-26 MED ORDER — METRONIDAZOLE 500 MG PO TABS: 500.0000 mg | ORAL_TABLET | Freq: Two times a day (BID) | ORAL | 0 refills | Status: AC

## 2024-08-26 NOTE — Progress Notes (Addendum)
 43 y.o. New GYN presents for Surgical Consult -Hysterectomy due to Fibroids.  C/o pelvic, abdominal pain 8/10.  Last PAP 2023 NILM Last Mammogram 06/2024  Administrations This Visit     cefTRIAXone  (ROCEPHIN ) injection 500 mg     Admin Date 08/26/2024 Action Given Dose 500 mg Route Intramuscular Documented By Burnard Niels PARAS, RMA

## 2024-08-26 NOTE — Progress Notes (Signed)
 NEW GYNECOLOGY VISIT Chief Complaint  Patient presents with   New Patient (Initial Visit)     Subjective:  Jaclyn Jackson is a 43 y.o. [id number] who presents for abnormal uterine bleeding and pelvic pain.  In person Spanish interpreter Donnal  Reports for the past two months she has had pelvic pain and abnormal uterine bleeding. She had daily bleeding for a month. She presented to the ER for the same complaints on 07/11/24 and was started on aygestin  which didn't help. She is now currently taking provera  10 mg BID which she endorses has helped her but still continues to bleed and have pain. Prior to this, she had normal monthly periods, bled for 7 days.  Describes pain as constant, irrespective of her period, feels like pressure/pain as if she were pregnant. Pain is mostly located on her left side. She is requesting hysterectomy to treat both the pain and bleeding.  Lab Results  Component Value Date   HGB 11.9 (L) 07/16/2024     Pelvic ultrasound 07/11/2024: FINDINGS: Uterus   Measurements: 11.0 x 5.5 x 7.2 cm = volume: 229 mL. 3.3 cm left fundal fibroid.   Endometrium   Thickness: 12 mm in thickness.  No focal abnormality visualized.   Right ovary   Measurements: Not visualized.  No adnexal mass seen.   Left ovary   Measurements: 2.6 x 2.6 x 3.5 cm = volume: 12 mL. 2.3 cm dominant follicle. No adnexal mass.   Pulsed Doppler evaluation of the left ovary demonstrates normal low-resistance arterial and venous waveforms.   Other findings   No abnormal free fluid. IMPRESSION: No acute findings. No evidence of ovarian mass or torsion. Right ovary not visualized.   3.3 cm left fundal fibroid.  Gyn History: Patient's last menstrual period was 08/19/2024 (exact date). Contraception: bilateral tubal ligation History of STIs: No Last pap: 2023 History of abnormal pap: No Periods: as above  OB History     Gravida  3   Para  3   Term  3   Preterm      AB       Living  3      SAB      IAB      Ectopic      Multiple      Live Births  3           Past Medical History:  Diagnosis Date   Anemia    Fibroid    Headache    Osteoporosis    Ovarian cyst    Photophobia     Past Surgical History:  Procedure Laterality Date   ABDOMINAL SURGERY     CESAREAN SECTION     COLONOSCOPY N/A 03/13/2024   Procedure: COLONOSCOPY;  Surgeon: Onita Elspeth Sharper, DO;  Location: [facility] ENDOSCOPY;  Service: Gastroenterology;  Laterality: N/A;   COLONOSCOPY N/A 04/24/2024   Procedure: COLONOSCOPY;  Surgeon: Onita Elspeth Sharper, DO;  Location: [facility] ENDOSCOPY;  Service: Gastroenterology;  Laterality: N/A;  SPANISH INTERPRETER   ESOPHAGOGASTRODUODENOSCOPY N/A 03/13/2024   Procedure: EGD (ESOPHAGOGASTRODUODENOSCOPY);  Surgeon: Onita Elspeth Sharper, DO;  Location: [facility] ENDOSCOPY;  Service: Gastroenterology;  Laterality: N/A;   TUBAL LIGATION      Social History   Socioeconomic History   Marital status: Married    Spouse name: Not on file   Number of children: 3   Years of education: Not on file   Highest education level: Not on file  Occupational History   Not on file  Tobacco Use   Smoking status: Never   Smokeless tobacco: Never  Vaping Use   Vaping status: Never Used  Substance and Sexual Activity   Alcohol use: No   Drug use: Never   Sexual activity: Yes  Other Topics Concern   Not on file  Social History Narrative   Family: mat great aunt- colon cancer in 38 ys.. [place] No smoking or alcohol. [employer] aware house.    Social Drivers of Corporate investment banker Strain: Low Risk  (05/21/2024)   [facility]   Overall Financial Resource Strain (CARDIA)    Difficulty of Paying Living Expenses: Not hard at all  Food Insecurity: No Food Insecurity (05/21/2024)   Received from [facility]   Hunger Vital Sign    Within the past 12 months, you worried that your food would run out  before you got the money to buy more.: Never true    Within the past 12 months, the food you bought just didn't last and you didn't have money to get more.: Never true  Transportation Needs: No Transportation Needs (05/21/2024)   Received from [facility] - Transportation    In the past 12 months, has lack of transportation kept you from medical appointments or from getting medications?: No    Lack of Transportation (Non-Medical): No  Physical Activity: Not on file  Stress: Not on file  Social Connections: Not on file    Family History  Problem Relation Age of Onset   Hypertension Mother    Heart disease Father    Cancer Maternal Aunt    Colon cancer Maternal Aunt    Diabetes Maternal Uncle    Diabetes Maternal Grandmother    Breast cancer Neg Hx     Current Outpatient Medications on File Prior to Visit  Medication Sig Dispense Refill   cyclobenzaprine  (FLEXERIL ) 10 MG tablet Take 1 tablet (10 mg total) by mouth at bedtime. 10 tablet 0   gabapentin (NEURONTIN) 300 MG capsule Take 300 mg in the morning and  600 mg at night     linaclotide (LINZESS) 145 MCG CAPS capsule Take 145 mcg by mouth daily before breakfast.     medroxyPROGESTERone  (PROVERA ) 10 MG tablet Take 1 tablet (10 mg total) by mouth every 12 (twelve) hours. 180 tablet 1   naproxen (NAPROSYN) 500 MG tablet Take 500 mg by mouth 2 (two) times daily as needed.     nortriptyline (PAMELOR) 10 MG capsule Take 20 mg by mouth daily. Take 20 mg qam and 50 mg at bedtime     Vitamin D, Ergocalciferol, (DRISDOL) 1.25 MG (50000 UNIT) CAPS capsule Take 50,000 Units by mouth every 7 (seven) days.     No current facility-administered medications on file prior to visit.    Allergies  Allergen Reactions   Acetaminophen  Itching   Hydrocodone -Acetaminophen  Itching     Objective:   Vitals:   08/26/24 0818  BP: 125/83  Pulse: 76  Weight: 166 lb (75.3 kg)  Height: 5' 2 (1.575 m)   Physical  Examination:   General appearance - well appearing, and in no distress  Mental status - alert, oriented to person, place, and time  Psych:  normal mood and affect  Skin - warm and dry, normal color, no suspicious lesions noted  Abdomen - soft, mildly tender in lower quadrants, nondistended, no masses or organomegaly  Pelvic -  VULVA: normal appearing vulva with no  masses, tenderness or lesions   VAGINA: normal appearing vagina with normal color and discharge, no lesions   CERVIX: normal appearing cervix without discharge or lesions, +cervical motion tenderness   Thin prep pap is done with HR HPV cotesting  UTERUS: uterus is felt to be normal size, shape, consistency and +uterine tenderness on exam  ADNEXA: +left adnexal tenderness  Extremities:  No swelling or varicosities noted  Chaperone present for exam   ENDOMETRIAL BIOPSY     The indications for endometrial biopsy were reviewed.   Risks of the biopsy including cramping, bleeding, infection, uterine perforation. The patient states she understands the R/B/I/A and agrees to undergo procedure today. Urine pregnancy test was Negative. Consent was signed. Time out was performed.    Patient was positioned in dorsal lithotomy position. A vaginal speculum was placed.  Hurricane spray applied. The cervix was visualized and was prepped with Betadine.  A single-toothed tenaculum was placed on the anterior lip of the cervix to stabilize it. The 3 mm pipelle was easily introduced into the endometrial cavity without difficulty to a depth of 9 cm, and a Moderate amount of tissue was obtained after one pass and sent to pathology. The instruments were removed from the patient's vagina. Minimal bleeding from the cervix was noted. The patient tolerated the procedure well.   Patient was given post procedure instructions.  Will follow up pathology and manage accordingly; patient will be contacted with results and recommendations.   Assessment and Plan:  1.  Abnormal uterine bleeding (AUB) (Primary) Not responding to oral medications. Recommend EMB which was performed today. We discussed medical and surgical options for treatment of abnormal uterine bleeding. She requests hysterectomy, which is not unreasonable. However, given that we are treating for PID vs endometritis (see below), I recommend completing antibiotic regimen and re-assessing for response. If symptoms improved, she may not need hysterectomy but will address at follow up visit. - Surgical pathology  2. Pelvic pain Patient with cervical motion and uterine tenderness that she states is the same as the pain she has been experiencing. Recommend treating for PID vs endometritis. F/u EMB as above. Will do antibiotic treatment x 2 weeks and have patient return to office in 3-4 weeks to assess response. If improved, may not need hysterectomy however can rediscuss as needed. We also discussed expectations in regards to surgery for pain and that if her uterus is not the source of her pain, she may continue to have pain after surgery. She verbalized her understanding of the above. - metroNIDAZOLE  (FLAGYL ) 500 MG tablet; Take 1 tablet (500 mg total) by mouth 2 (two) times daily for 14 days.  Dispense: 28 tablet; Refill: 0 - doxycycline  (VIBRAMYCIN ) 100 MG capsule; Take 1 capsule (100 mg total) by mouth 2 (two) times daily for 14 days.  Dispense: 28 capsule; Refill: 0 - Cervicovaginal ancillary only( [place])  3. Cervical cancer screening - Cytology - PAP( [place])     Return in about 4 weeks (around 09/23/2024) for Pelvic pain f/u.  Future Appointments  Date Time Provider Department Center  09/24/2024 11:15 AM Constant, Winton, MD [facility] None  11/14/2024  2:30 PM [facility] None  11/14/2024  2:45 PM Rennie Cindy SAUNDERS, MD [facility] None  11/14/2024  3:00 PM [facility] INFUSION CHAIR 10 [facility] None    Rollo ONEIDA Bring, MD, FACOG Obstetrician & Gynecologist, [facility] for [facility], [facility]

## 2024-08-26 NOTE — Addendum Note (Signed)
 Addended by: Noe Goyer J on: 08/26/2024 09:55 AM   Modules accepted: Orders

## 2024-08-27 ENCOUNTER — Ambulatory Visit: Payer: Self-pay | Admitting: Obstetrics and Gynecology

## 2024-08-27 LAB — CERVICOVAGINAL ANCILLARY ONLY
Chlamydia: NEGATIVE
Comment: NEGATIVE
Comment: NEGATIVE
Comment: NORMAL
Neisseria Gonorrhea: NEGATIVE
Trichomonas: NEGATIVE

## 2024-08-27 LAB — SURGICAL PATHOLOGY

## 2024-08-28 LAB — CYTOLOGY - PAP
Comment: NEGATIVE
Diagnosis: NEGATIVE
High risk HPV: NEGATIVE

## 2024-09-02 ENCOUNTER — Ambulatory Visit: Admission: EM | Admit: 2024-09-02 | Discharge: 2024-09-02 | Disposition: A | Discharge status: Home / Self Care

## 2024-09-02 DIAGNOSIS — R519 Headache, unspecified: Secondary | ICD-10-CM

## 2024-09-02 MED ORDER — SUMATRIPTAN SUCCINATE 6 MG/0.5ML ~~LOC~~ SOLN
6.0000 mg | Freq: Once | SUBCUTANEOUS | Status: AC
  Administered 2024-09-02: 6 mg via SUBCUTANEOUS

## 2024-09-02 MED ORDER — SUMATRIPTAN SUCCINATE 100 MG PO TABS: 100.0000 mg | ORAL_TABLET | ORAL | 0 refills | Status: DC | PRN

## 2024-09-02 NOTE — Discharge Instructions (Addendum)
 Debe contactar a su gineclogo/obstetra para hablar sobre un cambio de antibitico, ya que la doxiciclina est agravando sus dolores de cabeza.  Tambin le recomiendo que contacte a su especialista en cefaleas para hablar sobre otras opciones de [place].  Le voy a recetar Imitrex. Puede tomar 100 mg segn sea necesario para el dolor de cabeza. Si el dolor de cabeza no desaparece, puede repetir una dosis cada 2 horas. No ms de 200 mg cada 24 horas.  Si presenta empeoramiento del dolor de cabeza, nuseas y vmitos (incontinencia, entumecimiento, hormigueo o debilidad en alguna de las extremidades), debe acudir a urgencias para una evaluacin.  You need to contact your OB/GYN to discuss changing the antibiotic given the doxycycline  is exacerbating your headaches.  I would also recommend that you contact your headache specialist to discuss additional treatment options for your headaches.  I am going to represcribe you Imitrex.  You may take 100 mg as needed for your headache.  If the headache does not resolve you may repeat 1 dose in 2 hours.  No more than 200 mg in every 24-hour period.  If you develop any worsening headache, nausea and vomiting we cannot keep down fluids, numbness, tingling, weakness in any of your extremities you need to go to the ER for evaluation.

## 2024-09-02 NOTE — ED Provider Notes (Signed)
 [facility]    CSN: [id number] Arrival date & time: 09/02/24  1212      History   Chief Complaint Chief Complaint  Patient presents with   Headache    HPI Jaclyn Jackson is a 43 y.o. female.   HPI  43 year old female with past medical history significant for normal uterine bleeding, headache disorder, osteoporosis, iron  deficiency anemia presents for evaluation of headache and light sensitivity that started 1 week ago.  Her symptoms began after she started doxycycline  as presumptive treatment for PID and possible endometritis.  She has been taking ibuprofen during the daytime and nortriptyline and gabapentin at nighttime without improvement of symptoms.  She had previously been prescribed Emgality but insurance will no longer cover it.  She has also used Relpax in the past but is not currently taking that medication.  She describes the pain as being in her occiput and coming over the top of her head behind her eyes.  This is a typical pattern for her headaches.  Spanish interpreter Clarita [phone] used for history and physical.  Past Medical History:  Diagnosis Date   Anemia    Fibroid    Headache    Osteoporosis    Ovarian cyst    Photophobia     Patient Active Problem List   Diagnosis Date Noted   Symptomatic anemia 01/10/2024   Need for vaccination 08/01/2021   Neck pain 12/21/2020   Chronic daily headache 08/11/2020   Ear pain, bilateral 07/26/2020   Nausea 07/26/2020   Photophobia 07/26/2020   Rebound headache 07/26/2020   Tingling 07/26/2020   Breast pain in female 09/15/2014    Past Surgical History:  Procedure Laterality Date   ABDOMINAL SURGERY     CESAREAN SECTION     COLONOSCOPY N/A 03/13/2024   Procedure: COLONOSCOPY;  Surgeon: Onita Elspeth Sharper, DO;  Location: [facility] ENDOSCOPY;  Service: Gastroenterology;  Laterality: N/A;   COLONOSCOPY N/A 04/24/2024   Procedure: COLONOSCOPY;  Surgeon: Onita Elspeth Sharper, DO;  Location: [facility]  ENDOSCOPY;  Service: Gastroenterology;  Laterality: N/A;  SPANISH INTERPRETER   ESOPHAGOGASTRODUODENOSCOPY N/A 03/13/2024   Procedure: EGD (ESOPHAGOGASTRODUODENOSCOPY);  Surgeon: Onita Elspeth Sharper, DO;  Location: [facility] ENDOSCOPY;  Service: Gastroenterology;  Laterality: N/A;   TUBAL LIGATION      OB History     Gravida  3   Para  3   Term  3   Preterm      AB      Living  3      SAB      IAB      Ectopic      Multiple      Live Births  3            Home Medications    Prior to Admission medications   Medication Sig Start Date End Date Taking? Authorizing Provider  pantoprazole (PROTONIX) 40 MG tablet TAKE 1 TABLET (40 MG TOTAL) BY MOUTH ONCE DAILY TAKE 15-20 MINS BEFORE MEAL 08/19/24  Yes [provider]  SUMAtriptan (IMITREX) 100 MG tablet Take 1 tablet (100 mg total) by mouth as needed for migraine. May repeat in 2 hours if headache persists or recurs.  Maximum of 200 mg in a 24-hour period. 09/02/24  Yes Bernardino Ditch, NP  cyclobenzaprine  (FLEXERIL ) 10 MG tablet Take 1 tablet (10 mg total) by mouth at bedtime. 03/30/23   White, Shelba SAUNDERS, NP  doxycycline  (VIBRAMYCIN ) 100 MG capsule Take 1 capsule (100 mg total) by mouth 2 (  two) times daily for 14 days. 08/26/24 09/09/24  Abigail Rollo DASEN, MD  gabapentin (NEURONTIN) 300 MG capsule Take 300 mg in the morning and  600 mg at night 03/05/23   [provider]  linaclotide LARUE) 145 MCG CAPS capsule Take 145 mcg by mouth daily before breakfast. 02/13/24   [provider]  medroxyPROGESTERone  (PROVERA ) 10 MG tablet Take 1 tablet (10 mg total) by mouth every 12 (twelve) hours. 07/23/24   Leigh Sober, MD  metroNIDAZOLE  (FLAGYL ) 500 MG tablet Take 1 tablet (500 mg total) by mouth 2 (two) times daily for 14 days. 08/26/24 09/09/24  Abigail Rollo DASEN, MD  naproxen (NAPROSYN) 500 MG tablet Take 500 mg by mouth 2 (two) times daily as needed.    [provider]  nortriptyline (PAMELOR) 10 MG  capsule Take 20 mg by mouth daily. Take 20 mg qam and 50 mg at bedtime 06/18/23   [provider]  Vitamin D, Ergocalciferol, (DRISDOL) 1.25 MG (50000 UNIT) CAPS capsule Take 50,000 Units by mouth every 7 (seven) days. 12/21/23   [provider]    Family History Family History  Problem Relation Age of Onset   Hypertension Mother    Heart disease Father    Cancer Maternal Aunt    Colon cancer Maternal Aunt    Diabetes Maternal Uncle    Diabetes Maternal Grandmother    Breast cancer Neg Hx     Social History Social History   Tobacco Use   Smoking status: Never   Smokeless tobacco: Never  Vaping Use   Vaping status: Never Used  Substance Use Topics   Alcohol use: No   Drug use: Never     Allergies   Acetaminophen  and Hydrocodone -acetaminophen    Review of Systems Review of Systems  Eyes:  Positive for photophobia.  Neurological:  Positive for headaches. Negative for weakness and numbness.     Physical Exam Triage Vital Signs ED Triage Vitals  Encounter Vitals Group     BP      Girls Systolic BP Percentile      Girls Diastolic BP Percentile      Boys Systolic BP Percentile      Boys Diastolic BP Percentile      Pulse      Resp      Temp      Temp src      SpO2      Weight      Height      Head Circumference      Peak Flow      Pain Score      Pain Loc      Pain Education      Exclude from Growth Chart    No data found.  Updated Vital Signs BP 119/80 (BP Location: Right Arm)   Pulse 75   Temp 99.6 F (37.6 C) (Oral)   Resp 14   Ht 5' 2 (1.575 m)   Wt 167 lb (75.8 kg)   LMP 08/19/2024 (Exact Date)   SpO2 98%   BMI 30.54 kg/m   Visual Acuity Right Eye Distance:   Left Eye Distance:   Bilateral Distance:    Right Eye Near:   Left Eye Near:    Bilateral Near:     Physical Exam Vitals and nursing note reviewed.  Constitutional:      Appearance: Normal appearance. She is not ill-appearing.  HENT:     Head:  Normocephalic and atraumatic.  Eyes:  General: No scleral icterus.       Right eye: No discharge.        Left eye: No discharge.     Extraocular Movements: Extraocular movements intact.     Conjunctiva/sclera: Conjunctivae normal.     Pupils: Pupils are equal, round, and reactive to light.     Comments: Pupils are equal and reactive and EOMs intact.  Normal red light reflex in both eyes.  Musculoskeletal:     Cervical back: Normal range of motion and neck supple. Tenderness present.  Lymphadenopathy:     Cervical: No cervical adenopathy.  Skin:    General: Skin is warm and dry.     Capillary Refill: Capillary refill takes less than 2 seconds.  Neurological:     General: No focal deficit present.     Mental Status: She is alert and oriented to person, place, and time.     Cranial Nerves: No cranial nerve deficit.     Sensory: No sensory deficit.     Motor: No weakness.     Gait: Gait normal.      UC Treatments / Results  Labs (all labs ordered are listed, but only abnormal results are displayed) Labs Reviewed - No data to display  EKG   Radiology No results found.  Procedures Procedures (including critical care time)  Medications Ordered in UC Medications  SUMAtriptan (IMITREX) injection 6 mg (has no administration in time range)    Initial Impression / Assessment and Plan / UC Course  I have reviewed the triage vital signs and the nursing notes.  Pertinent labs & imaging results that were available during my care of the patient were reviewed by me and considered in my medical decision making (see chart for details).   Patient is a pleasant, nontoxic-appearing 43 year old female presenting for evaluation of headache and photophobia.  Symptoms began after she started taking doxycycline  that she was prescribed for abnormal uterine bleeding.  She was to be prescribed metronidazole  500 mg twice daily for 14 days along with doxycycline  100 mg twice daily for 14 days.   She reports that she does not remember being prescribed the metronidazole , and she does not have it in her bag of medications, and she stopped taking the doxycycline  because it made her headaches worse.  I advised the patient that this is unfortunately a side effect of doxycycline .  On exam she is not in any acute distress.  She is alert and oriented x 3.  Cranial nerves II through XII are grossly intact.  She is moving all extremities independently.  She does have significant tension in the paraspinous muscles of her cervical region bilaterally as well as tenderness with palpation of her scalp.  She denies ever being previously diagnosed with tension headaches.  She does have a history of cervical radiculitis.  Her most recent visit for her headache disorder was to [place] [facility] review of MRI shows no evidence of acute intracranial abnormality.  MRI dated 01/01/2024.  She was advised to continue her Emgality injections, though she reports that insurance stopped paying for those so she is no longer taking that.  At her most recent visit she was to take nortriptyline 20 mg in the morning, 50 mg at night, gabapentin 300 mg in the morning and 600 mg at night.  She had previously tried Maxalt which caused itching.  The patient reports that she has come here previously for treatment of headache this severe.  However, I cannot find any records of  that in epic.  Given that she is currently taking ibuprofen without any improvement of symptoms and has been on Imitrex in the past I will order 6 mg of IM Imitrex to be administered here in clinic and then discharge her home.  I will also restart her on Imitrex for home use.  I advised her to contact her OB/GYN to discuss switching her antibiotic given that the doxycycline  is exacerbating her headaches.  She also needs to make a follow-up appointment with the headache clinic.   Final Clinical Impressions(s) / UC Diagnoses   Final diagnoses:  Acute intractable headache,  unspecified headache type     Discharge Instructions      Debe contactar a su gineclogo/obstetra para hablar sobre un cambio de antibitico, ya que la doxiciclina est agravando sus dolores de cabeza.  Tambin le recomiendo que contacte a su especialista en cefaleas para hablar sobre otras opciones de [place].  Le voy a recetar Imitrex. Puede tomar 100 mg segn sea necesario para el dolor de cabeza. Si el dolor de cabeza no desaparece, puede repetir una dosis cada 2 horas. No ms de 200 mg cada 24 horas.  Si presenta empeoramiento del dolor de cabeza, nuseas y vmitos (incontinencia, entumecimiento, hormigueo o debilidad en alguna de las extremidades), debe acudir a urgencias para una evaluacin.  You need to contact your OB/GYN to discuss changing the antibiotic given the doxycycline  is exacerbating your headaches.  I would also recommend that you contact your headache specialist to discuss additional treatment options for your headaches.  I am going to represcribe you Imitrex.  You may take 100 mg as needed for your headache.  If the headache does not resolve you may repeat 1 dose in 2 hours.  No more than 200 mg in every 24-hour period.  If you develop any worsening headache, nausea and vomiting we cannot keep down fluids, numbness, tingling, weakness in any of your extremities you need to go to the ER for evaluation.     ED Prescriptions     Medication Sig Dispense Auth. Provider   SUMAtriptan (IMITREX) 100 MG tablet Take 1 tablet (100 mg total) by mouth as needed for migraine. May repeat in 2 hours if headache persists or recurs.  Maximum of 200 mg in a 24-hour period. 10 tablet Bernardino Ditch, NP      PDMP not reviewed this encounter.   Bernardino Ditch, NP 09/02/24 1313

## 2024-09-02 NOTE — ED Triage Notes (Signed)
 Pt c/o HA & photophobia x1 wk. States was seen by OB on 10/21 & given doxy for AUB. States when she started abx HA's started. Hx of migraines. Has also tried IBU w/o relief.

## 2024-09-24 ENCOUNTER — Ambulatory Visit: Admitting: Obstetrics and Gynecology

## 2024-09-24 ENCOUNTER — Encounter: Payer: Self-pay | Admitting: Obstetrics and Gynecology

## 2024-09-24 VITALS — BP 118/77 | HR 88 | Ht 62.0 in | Wt 173.0 lb

## 2024-09-24 DIAGNOSIS — R102 Pelvic and perineal pain unspecified side: Secondary | ICD-10-CM

## 2024-09-24 DIAGNOSIS — N939 Abnormal uterine and vaginal bleeding, unspecified: Secondary | ICD-10-CM

## 2024-09-24 NOTE — Progress Notes (Signed)
 43 y.o. GYN presents for Pelvic pain follow-up. Pt stated that the RX given for the pain gave her severe migraine, she went to the ER and was told to stop taking it.  C/o pain is 8/10, vaginal bleeding.

## 2024-09-24 NOTE — Progress Notes (Signed)
 43 yo P3 with LMP 08/19/24 and BMI 31 returning for follow up on pelvic pain and AUB. Patient was seen on 08/26/24 with same complaints. She was treated for presumed PID but patient only took 2 days worth of antibiotics due to a migraine headache. Patient states she was instructed by ED staff to discontinue antibiotics and was not able to reach office staff for guidance. Patient reports worsening pelvic pain rating it 8/10 with continued irregular bleeding. She denies any fever or abnormal discharge. Patient is requesting a hysterectomy as soon as possible  Past Medical History:  Diagnosis Date   Anemia    Fibroid    Headache    Osteoporosis    Ovarian cyst    Photophobia    Past Surgical History:  Procedure Laterality Date   ABDOMINAL SURGERY     CESAREAN SECTION     COLONOSCOPY N/A 03/13/2024   Procedure: COLONOSCOPY;  Surgeon: Onita Elspeth Sharper, DO;  Location: [facility] ENDOSCOPY;  Service: Gastroenterology;  Laterality: N/A;   COLONOSCOPY N/A 04/24/2024   Procedure: COLONOSCOPY;  Surgeon: Onita Elspeth Sharper, DO;  Location: [facility] ENDOSCOPY;  Service: Gastroenterology;  Laterality: N/A;  SPANISH INTERPRETER   ESOPHAGOGASTRODUODENOSCOPY N/A 03/13/2024   Procedure: EGD (ESOPHAGOGASTRODUODENOSCOPY);  Surgeon: Onita Elspeth Sharper, DO;  Location: [facility] ENDOSCOPY;  Service: Gastroenterology;  Laterality: N/A;   TUBAL LIGATION     Family History  Problem Relation Age of Onset   Hypertension Mother    Heart disease Father    Cancer Maternal Aunt    Colon cancer Maternal Aunt    Diabetes Maternal Uncle    Diabetes Maternal Grandmother    Breast cancer Neg Hx    Social History   Tobacco Use   Smoking status: Never   Smokeless tobacco: Never  Vaping Use   Vaping status: Never Used  Substance Use Topics   Alcohol use: No   Drug use: Never   ROS See pertinent in HPI. All other systems reviewed and non contributory Blood pressure 118/77, pulse 88, height 5' 2 (1.575 m), weight 173 lb  (78.5 kg), last menstrual period 08/19/2024. GENERAL: Well-developed, well-nourished female in no acute distress.  ABDOMEN: Soft, nontender, nondistended. No organomegaly. NEURO: alert and oriented x 3  DG INJECT DIAG/THERA/INC NEEDLE/CATH/PLC EPI/CERV/THOR W/IMG Result Date: 07/28/2024 CLINICAL DATA:  43 year old female with cervical spondylosis without myelopathy. She has neck pain radiating into both shoulders. She experiences minimal relief following injection performed 06/23/2024. She presents for repeat injection. FLUOROSCOPY: Radiation Exposure Index (as provided by the fluoroscopic device): 5.6 mGy Kerma PROCEDURE: CERVICAL EPIDURAL INJECTION An interlaminar approach was performed on the left at C7-T1. A 20 gauge Touhy epidural needle was advanced using loss-of-resistance technique. DIAGNOSTIC EPIDURAL INJECTION Injection of Isovue -M 300 shows a good epidural pattern with spread above and below the level of needle placement, primarily on the left. No vascular opacification is seen. THERAPEUTIC EPIDURAL INJECTION 1.5 ml of Kenalog  40 mixed with 2 ml of normal saline were then instilled. The procedure was well-tolerated, and the patient was discharged thirty minutes following the injection in good condition. IMPRESSION: Technically successful epidural injection on the left at C7-T1. Electronically Signed   By: Wilkie Lent M.D.   On: 07/28/2024 11:44   US  PELVIC COMPLETE W TRANSVAGINAL AND TORSION R/O Result Date: 07/11/2024 CLINICAL DATA:  Left pelvic pain EXAM: TRANSABDOMINAL AND TRANSVAGINAL ULTRASOUND OF PELVIS DOPPLER ULTRASOUND OF OVARIES TECHNIQUE: Both transabdominal and transvaginal ultrasound examinations of the pelvis were performed. Transabdominal technique was performed for global imaging  of the pelvis including uterus, ovaries, adnexal regions, and pelvic cul-de-sac. It was necessary to proceed with endovaginal exam following the transabdominal exam to visualize the uterus,  endometrium, ovaries and adnexa. Color and duplex Doppler ultrasound was utilized to evaluate blood flow to the ovaries. COMPARISON:  01/25/2020 FINDINGS: Uterus Measurements: 11.0 x 5.5 x 7.2 cm = volume: 229 mL. 3.3 cm left fundal fibroid. Endometrium Thickness: 12 mm in thickness.  No focal abnormality visualized. Right ovary Measurements: Not visualized.  No adnexal mass seen. Left ovary Measurements: 2.6 x 2.6 x 3.5 cm = volume: 12 mL. 2.3 cm dominant follicle. No adnexal mass. Pulsed Doppler evaluation of the left ovary demonstrates normal low-resistance arterial and venous waveforms. Other findings No abnormal free fluid. IMPRESSION: No acute findings. No evidence of ovarian mass or torsion. Right ovary not visualized. 3.3 cm left fundal fibroid. Electronically Signed   By: Franky Crease M.D.   On: 07/11/2024 17:13   08/2024 Endometrial biopsy- normal  A/P 43 yo with pelvic pain and AUB - Discussed importance of completing antibiotic regimen prior to proceeding to surgical management. Patient verbalized understanding and plans to take them - Informed patient that if pain significantly improved following antibiotic course, hysterectomy may not be necessary - Patient verbalized understanding and all questions answered - Patient with normal pap smear 08/2024 - Spanish interpreter present during evaluation - Patient to follow up in 3-4 weeks with Dr. Jeralyn for possible robotic assisted hysterectomy, if still desired

## 2024-11-13 ENCOUNTER — Ambulatory Visit: Admission: EM | Admit: 2024-11-13 | Discharge: 2024-11-13 | Disposition: A | Discharge status: Home / Self Care

## 2024-11-13 DIAGNOSIS — R1032 Left lower quadrant pain: Secondary | ICD-10-CM | POA: Diagnosis not present

## 2024-11-13 NOTE — ED Provider Notes (Signed)
 [facility] Emergency Department Provider Note   ED Clinical Impression   Final diagnoses:  Fibroid (Primary)  Left ovarian cyst    Medical Decision Making, ED Course   Medical Decision Making 44 year old female with no prior history of abdominal pain presented with acute onset constant left lower abdominal pain since yesterday, worsened by sitting upright. She reports associated nausea without vomiting, and denies fever, dysuria, vaginal discharge, chest pain, or shortness of breath. She has a remote history of surgical sterilization and is currently taking ibuprofen for pain.  Differential includes diverticulitis, colitis, enteritis, pancreatitis, acute surgical abdomen such as appendicitis, perforated viscus, obstruction, cholelithiasis, cholangitis, SBO, volvulus, mesenteric ischemia, bowel perforation, hernia, aortic dissection.  Considered pelvic and disease, but low suspicion given the description of pain and age of patient as well as history.  Considered ovarian torsion, but feel less likely given the constant nature of the pain.  Possible uterine fibroid.  Pregnancy test obtained in triage was negative, so low concern for ectopic pregnancy.  Given the broad differential of abdominal pain, will obtain basic labs.  Lipase to evaluate for pancreatitis.  CBC to evaluate for leukocytosis or anemia.  Chemistry to evaluate for electrolyte abnormalities, acidosis or alkalosis, and liver function test to evaluate for hepato-biliary disease.  Urinalysis to evaluate for cystitis, pyelonephritis, hematuria suggest renal colic.  Will plan to obtain CT abdomen/pelvis to evaluate for diverticulitis versus other acute intra-abdominal pathology.  Will additionally plan to obtain pelvic ultrasound to evaluate the uterus and ovaries.  Will give morphine  for pain and Zofran  for nausea.  Orders Placed This Encounter  Procedures   Urine Culture   CT Abdomen Pelvis W IV Contrast Only   US  Endovaginal  (Non-OB)   CBC w/ Differential   Comprehensive Metabolic Panel   Lipase Level   Urinalysis with Microscopy with Culture Reflex   Pregnancy Qualitative, Urine   Urinalysis with Microscopy with Culture Reflex    ED Course as of 11/13/24 2337  Thu Nov 13, 2024  1934 Urinalysis with Microscopy with Culture Reflex(!):   Color, UA Yellow  Clarity, UA Turbid  Spec Grav, UA 1.026  PH UA 6.5  Leukocyte Esterase, UA Negative  Nitrite, UA Negative  Protein, UA Negative  Glucose, UA Negative  Ketones, UA Negative  Urobilinogen, UA 6.0 mg/dL(!)  Bilirubin, UA Negative  Blood, UA Negative  RBC, UA 1  WBC, UA 1  Squam Epithel, UA 10(!)  Bacteria, UA Rare(!)  Mucus, UA Rare(!) No UTI  1934 Preg Test, Ur: Negative  1934 Lipase: 26  1934 Comprehensive Metabolic Panel(!):   Sodium 143  Potassium 4.0  Chloride 102  CO2 28.0  Anion Gap 13  Bun 13  Creatinine 0.85  BUN/Creatinine Ratio 15  eGFR CKD-EPI (2021) Female 88  Glucose 88  Calcium 9.4  Albumin 3.9  Total Protein 7.0  Total Bilirubin 0.4  SGOT (AST) 66(!)  ALT 98(!)  Alkaline Phosphatase 96 Mild LFT elevation, no other acute abnormalities  1935 CBC w/ Differential(!):   WBC 4.4  RBC 4.62  HGB 13.1  HCT 38.2  MCV 82.9  MCH 28.5  MCHC 34.3  RDW 15.9(!)  MPV 8.6  Platelet 158  nRBC 0  Neutrophils % 69.5  Lymphocytes % 15.9  Monocytes % 12.5  Eosinophils % 1.7  Basophils % 0.4  Absolute Neutrophils 3.0  Absolute Lymphocytes 0.7(!)  Absolute Monocytes  0.5  Absolute Eosinophils 0.1  Absolute Basophils  0.0 No acute abnormalities  2115 CT Abdomen Pelvis W  IV Contrast Only IMPRESSION: -Findings compatible with degenerating left uterine fibroid which can be a pain generator. No other acute abnormalities in the abdomen or pelvis.   2247 US  Endovaginal (Non-OB) IMPRESSION:   No left ovarian torsion.   The right ovary is not seen.   Heterogeneous cystic lesion within the left ovary with thick  irregular internal septation and without circumferential Doppler flow. Findings likely reflect a benign involuting corpus luteal cyst however in the absence of typical peripheral vascularity consider nonemergent follow-up ultrasound in 6 months. O RADS-3.   2335 I have educated the patient on the diagnoses of the CT and the ultrasound.  She has follow-up with her gynecologist in the morning.  Will plan to send prescription for naproxen and oxycodone  for breakthrough pain.  Medication side effects discussed.  Will plan to discharge at this time.  2336 Results and decision making discussed in depth with the patient and family at beside, if available. I discussed plan to discharge the patient and the important need for follow up. They agree with plan. I discussed strict return precautions with them, which were included in my discharge instructions, and they understand and agree to come back to the Emergency Department if their symptoms are persistent for a repeat exam in 8-12 hrs, or sooner if things change/worsen. They express understanding, and patient is discharged in stable condition.     MDM Elements Any discussion of this patient's case/presentation between myself and consultants, admitting teams, or other team members has been documented above. Imaging and other studies, if performed, that were available during my care of the patient were independently reviewed and interpreted by me and considered in my medical decision making as documented above. External records reviewed: Urgent care note from today ____________________________________________  The case was discussed with the attending physician, who is in agreement with the above assessment and plan.    HPI   History of Present Illness Quantasia Stegner is a 44 year old female who presents with acute left-sided abdominal pain.  She has been experiencing acute left-sided abdominal pain since yesterday. The pain is constant and worsens when  sitting upright, prompting her to sit slightly to the side for relief. No previous episodes of similar pain.  She experiences associated nausea but no vomiting, fever, dysuria, chest pain, dyspnea, or vaginal discharge. Her last menstrual period was on December 19, and she has been surgically sterilized for 14 years, thus she does not use any contraceptive medication. She has been taking ibuprofen 800 mg at home which has not been helping.  She does not have any concern for recent exposure to sexually transmitted diseases.     History   Past Medical History[1]  Past Surgical History[2]  No current facility-administered medications for this encounter.  Current Outpatient Medications:    amitriptyline (ELAVIL) 10 MG tablet, 10 mg., Disp: , Rfl:    ibuprofen (ADVIL,MOTRIN) 600 MG tablet, 600 mg., Disp: , Rfl:    naproxen (NAPROSYN) 500 MG tablet, Take 1 tablet (500 mg total) by mouth in the morning and 1 tablet (500 mg total) in the evening. Take with meals. Do all this for 15 days., Disp: 30 tablet, Rfl: 0   oxyCODONE  (ROXICODONE ) 5 MG immediate release tablet, Take 1 tablet (5 mg total) by mouth every four (4) hours as needed for pain (not relieved by scheduled tylenol ) for up to 10 doses., Disp: 10 tablet, Rfl: 0  Allergies Acetaminophen  and Hydrocodone -acetaminophen   Family History Family History[3]  Social History Short  Social History[4]   Physical Exam   VITAL SIGNS:    Vitals:   11/13/24 1425 11/13/24 1801  BP: 126/75 112/74  Pulse: 91 71  Resp: 18 16  Temp: 36.6 C (97.9 F) 36.7 C (98 F)  TempSrc: Oral   SpO2: 100% 100%  Weight: 70.8 kg (156 lb)     Constitutional: Alert and oriented. No acute distress. Eyes: Conjunctivae are normal. Neck: Supple, no meningismus.  HEENT: Normocephalic and atraumatic. Conjunctivae clear. No congestion. Moist mucous membranes.  Cardiovascular: Rate as above, regular rhythm. Normal and symmetric distal pulses. Brisk capillary  refill. Normal skin turgor. Respiratory: Normal respiratory effort. Breath sounds are normal. There are no wheezing or crackles heard. Gastrointestinal: Tenderness to palpation to the LLQ. Soft and non-distended.  Genitourinary: Deferred. Musculoskeletal: Non-tender with normal range of motion in all extremities. No lower extremity edema.  Neurologic: Normal speech and language. No gross focal neurologic deficits are appreciated. Patient is moving all extremities equally, face is symmetric at rest and with speech. Skin: Skin is warm, dry and intact. No rash noted. Psychiatric: Mood and affect are normal. Speech and behavior are normal.   Radiology   US  Endovaginal (Non-OB)  Final Result    No left ovarian torsion.    The right ovary is not seen.    Heterogeneous cystic lesion within the left ovary with thick irregular internal septation and without circumferential Doppler flow. Findings likely reflect a benign involuting corpus luteal cyst however in the absence of typical peripheral vascularity consider nonemergent follow-up ultrasound in 6 months. O RADS-3.        CT Abdomen Pelvis W IV Contrast Only  Final Result  -Findings compatible with degenerating left uterine fibroid which can be a pain generator. No other acute abnormalities in the abdomen or pelvis.         Laboratory Results   Lab Results  Component Value Date   WBC 4.4 11/13/2024   HGB 13.1 11/13/2024   HCT 38.2 11/13/2024   PLT 158 11/13/2024    Lab Results  Component Value Date   NA 143 11/13/2024   K 4.0 11/13/2024   CL 102 11/13/2024   CO2 28.0 11/13/2024   BUN 13 11/13/2024   CREATININE 0.85 11/13/2024   GLU 88 11/13/2024   CALCIUM 9.4 11/13/2024    Lab Results  Component Value Date   BILITOT 0.4 11/13/2024   PROT 7.0 11/13/2024   ALBUMIN 3.9 11/13/2024   ALT 98 (H) 11/13/2024   AST 66 (H) 11/13/2024   ALKPHOS 96 11/13/2024    No results found for: LABPROT, INR, APTT  Pertinent  labs & imaging results that were available during my care of the patient were independently interpreted by me and considered in my medical decision making (see chart for details).  Portions of this record have been created using Scientist, clinical (histocompatibility and immunogenetics). Dictation errors have been sought, but may not have been identified and corrected.       [1] Past Medical History: Diagnosis Date   Ovarian cyst 2011  [2] Past Surgical History: Procedure Laterality Date   CESAREAN SECTION, LOW TRANSVERSE  2011   [place]    tubal ligation Bilateral 2011   [place]   [3] Family History Problem Relation Age of Onset   Cancer Maternal Aunt        Colon   Diabetes Maternal Grandmother    Hypertension Maternal Grandmother    Diabetes Paternal Grandmother    Hypertension Paternal Grandmother    Stroke Neg Hx  Anesthesia problems Neg Hx    Clotting disorder Neg Hx   [4] Social History Tobacco Use   Smoking status: Never   Smokeless tobacco: Never  Substance Use Topics   Alcohol use: No   Drug use: No   Armato, Alexandra M, MD Resident 11/13/24 [phone]

## 2024-11-13 NOTE — ED Triage Notes (Signed)
 Interpreter: Charolet [phone]  Patient having left side ovarian/abdominal pain onset yesterday. Patient states was diagnosed with fibroids a few months ago. Denies any urinary symptoms. Deneis any vaginal pain, discharge or itching.   Patient tried ibuprofen 800 mg with no relief.

## 2024-11-13 NOTE — ED Notes (Signed)
 Patient is being discharged from the Urgent Care and sent to the Emergency Department via Personal Vehicle . Per Rilla, NP, patient is in need of higher level of care due to Lower Abdominal Pain. Patient is aware and verbalizes understanding of plan of care.  Vitals:   11/13/24 1102  BP: 105/72  Pulse: 80  Resp: 16  Temp: 98.5 F (36.9 C)  SpO2: 97%

## 2024-11-13 NOTE — Discharge Instructions (Addendum)
 Please go to ER for further evaluation of severe LLQ pain 10/10. Do not eat or drink anything until seen by ER provider. Pt given instructions via translator Charolet, pt verbalized understanding.

## 2024-11-13 NOTE — ED Provider Notes (Signed)
 " [facility]    CSN: [id number] Arrival date & time: 11/13/24  1024      History   Chief Complaint Chief Complaint  Patient presents with   Abdominal Pain    HPI Jaclyn Jackson is a 44 y.o. female.   44 year old female pt, Jaclyn Jackson, presents to urgent care for evaluation of  abdominal/ovarian pain x 1 day. Left ovary pain x 1 day. Heating pad did not help, pt took ibuprofen 800 mg po without relief. Pt denies any vaginal bleeding or dysuria. Pain is constant with laying and sitting, rates pain as pain 10/10, + nausea.  Pt was seen by GYN on 09/24/2024 for same:follow up of pelvic pain and  AUB, advised to f/u with Dr. Jeralyn next month. Pt has not followed up, appt is tomorrow with GYN but cannot wait d/t severe pain.   The history is provided by the patient. A language interpreter was used Rodolph [phone]).    Past Medical History:  Diagnosis Date   Anemia    Fibroid    Headache    Osteoporosis    Ovarian cyst    Photophobia     Patient Active Problem List   Diagnosis Date Noted   LLQ abdominal pain 11/13/2024   Symptomatic anemia 01/10/2024   Need for vaccination 08/01/2021   Neck pain 12/21/2020   Chronic daily headache 08/11/2020   Ear pain, bilateral 07/26/2020   Nausea 07/26/2020   Photophobia 07/26/2020   Rebound headache 07/26/2020   Tingling 07/26/2020   Breast pain in female 09/15/2014    Past Surgical History:  Procedure Laterality Date   ABDOMINAL SURGERY     CESAREAN SECTION     COLONOSCOPY N/A 03/13/2024   Procedure: COLONOSCOPY;  Surgeon: Onita Elspeth Sharper, DO;  Location: [facility] ENDOSCOPY;  Service: Gastroenterology;  Laterality: N/A;   COLONOSCOPY N/A 04/24/2024   Procedure: COLONOSCOPY;  Surgeon: Onita Elspeth Sharper, DO;  Location: [facility] ENDOSCOPY;  Service: Gastroenterology;  Laterality: N/A;  SPANISH INTERPRETER   ESOPHAGOGASTRODUODENOSCOPY N/A 03/13/2024   Procedure: EGD (ESOPHAGOGASTRODUODENOSCOPY);  Surgeon:  Onita Elspeth Sharper, DO;  Location: [facility] ENDOSCOPY;  Service: Gastroenterology;  Laterality: N/A;   TUBAL LIGATION      OB History     Gravida  3   Para  3   Term  3   Preterm      AB      Living  3      SAB      IAB      Ectopic      Multiple      Live Births  3            Home Medications    Prior to Admission medications  Medication Sig Start Date End Date Taking? Authorizing Provider  gabapentin (NEURONTIN) 300 MG capsule Take 300 mg in the morning and  600 mg at night 03/05/23  Yes [provider]  ibuprofen (ADVIL) 800 MG tablet Take 800 mg by mouth.   Yes [provider]  naproxen (NAPROSYN) 500 MG tablet Take 500 mg by mouth 2 (two) times daily as needed.   Yes [provider]  nortriptyline (PAMELOR) 10 MG capsule Take 20 mg by mouth daily. Take 20 mg qam and 50 mg at bedtime 06/18/23  Yes [provider]  cyclobenzaprine  (FLEXERIL ) 10 MG tablet Take 1 tablet (10 mg total) by mouth at bedtime. 03/30/23   Teresa Shelba SAUNDERS, NP  linaclotide (LINZESS) 145 MCG  CAPS capsule Take 145 mcg by mouth daily before breakfast. 02/13/24   [provider]  medroxyPROGESTERone  (PROVERA ) 10 MG tablet Take 1 tablet (10 mg total) by mouth every 12 (twelve) hours. 07/23/24   Leigh Sober, MD  pantoprazole (PROTONIX) 40 MG tablet TAKE 1 TABLET (40 MG TOTAL) BY MOUTH ONCE DAILY TAKE 15-20 MINS BEFORE MEAL 08/19/24   [provider]  SUMAtriptan  (IMITREX ) 100 MG tablet Take 1 tablet (100 mg total) by mouth as needed for migraine. May repeat in 2 hours if headache persists or recurs.  Maximum of 200 mg in a 24-hour period. 09/02/24   Bernardino Ditch, NP  Vitamin D, Ergocalciferol, (DRISDOL) 1.25 MG (50000 UNIT) CAPS capsule Take 50,000 Units by mouth every 7 (seven) days. 12/21/23   [provider]    Family History Family History  Problem Relation Age of Onset   Hypertension Mother    Heart disease Father    Cancer  Maternal Aunt    Colon cancer Maternal Aunt    Diabetes Maternal Uncle    Diabetes Maternal Grandmother    Breast cancer Neg Hx     Social History Social History[1]   Allergies   Acetaminophen  and Hydrocodone -acetaminophen    Review of Systems Review of Systems  Constitutional:  Negative for fever.  Gastrointestinal:  Positive for abdominal pain and nausea. Negative for constipation, diarrhea and vomiting.  Genitourinary:  Negative for dysuria, vaginal bleeding and vaginal discharge.  All other systems reviewed and are negative.    Physical Exam Triage Vital Signs ED Triage Vitals  Encounter Vitals Group     BP      Girls Systolic BP Percentile      Girls Diastolic BP Percentile      Boys Systolic BP Percentile      Boys Diastolic BP Percentile      Pulse      Resp      Temp      Temp src      SpO2      Weight      Height      Head Circumference      Peak Flow      Pain Score      Pain Loc      Pain Education      Exclude from Growth Chart    No data found.  Updated Vital Signs BP 105/72 (BP Location: Right Arm)   Pulse 80   Temp 98.5 F (36.9 C) (Oral)   Resp 16   Ht 5' 2 (1.575 m)   Wt 166 lb (75.3 kg)   LMP 10/24/2024 (Exact Date)   SpO2 97%   BMI 30.36 kg/m   Visual Acuity Right Eye Distance:   Left Eye Distance:   Bilateral Distance:    Right Eye Near:   Left Eye Near:    Bilateral Near:     Physical Exam Vitals and nursing note reviewed.  Constitutional:      General: She is not in acute distress.    Appearance: She is well-developed.  HENT:     Head: Normocephalic and atraumatic.  Eyes:     Conjunctiva/sclera: Conjunctivae normal.  Cardiovascular:     Rate and Rhythm: Normal rate and regular rhythm.     Heart sounds: No murmur heard. Pulmonary:     Effort: Pulmonary effort is normal. No respiratory distress.     Breath sounds: Normal breath sounds.  Abdominal:     General: Bowel sounds are normal. There  is distension.      Palpations: Abdomen is soft.     Tenderness: There is abdominal tenderness in the left lower quadrant. There is guarding. There is no rebound.  Musculoskeletal:        General: No swelling.     Cervical back: Neck supple.  Skin:    General: Skin is warm and dry.     Capillary Refill: Capillary refill takes less than 2 seconds.  Neurological:     General: No focal deficit present.     Mental Status: She is alert and oriented to person, place, and time.     GCS: GCS eye subscore is 4. GCS verbal subscore is 5. GCS motor subscore is 6.  Psychiatric:        Attention and Perception: Attention normal.        Mood and Affect: Mood normal.        Speech: Speech normal.        Behavior: Behavior normal.      UC Treatments / Results  Labs (all labs ordered are listed, but only abnormal results are displayed) Labs Reviewed - No data to display  EKG   Radiology No results found.  Procedures Procedures (including critical care time)  Medications Ordered in UC Medications - No data to display  Initial Impression / Assessment and Plan / UC Course  I have reviewed the triage vital signs and the nursing notes.  Pertinent labs & imaging results that were available during my care of the patient were reviewed by me and considered in my medical decision making (see chart for details).    Please go to ER for further evaluation of severe LLQ pain 10/10. Do not eat or drink anything until seen by ER provider. Pt given instructions via translator Charolet, pt verbalized understanding.   Ddx: Severe LLQ pain,ovarian torsion, fibroids, ovarian cyst, diverticulitis, PID Final Clinical Impressions(s) / UC Diagnoses   Final diagnoses:  LLQ abdominal pain     Discharge Instructions      Please go to ER for further evaluation of severe LLQ pain 10/10. Do not eat or drink anything until seen by ER provider. Pt given instructions via translator Charolet, pt verbalized understanding.      ED Prescriptions   None    PDMP not reviewed this encounter.     [1]  Social History Tobacco Use   Smoking status: Never   Smokeless tobacco: Never  Vaping Use   Vaping status: Never Used  Substance Use Topics   Alcohol use: No   Drug use: Never     Easton Sivertson, Rilla, NP 11/13/24 1547  "

## 2024-11-14 ENCOUNTER — Inpatient Hospital Stay: Attending: Internal Medicine

## 2024-11-14 ENCOUNTER — Inpatient Hospital Stay

## 2024-11-14 ENCOUNTER — Ambulatory Visit: Admitting: Obstetrics and Gynecology

## 2024-11-14 ENCOUNTER — Encounter: Payer: Self-pay | Admitting: Obstetrics and Gynecology

## 2024-11-14 ENCOUNTER — Encounter: Payer: Self-pay | Admitting: Internal Medicine

## 2024-11-14 ENCOUNTER — Inpatient Hospital Stay: Admitting: Internal Medicine

## 2024-11-14 VITALS — BP 116/76 | HR 78 | Ht 62.0 in | Wt 167.4 lb

## 2024-11-14 VITALS — BP 118/79 | HR 77

## 2024-11-14 DIAGNOSIS — D259 Leiomyoma of uterus, unspecified: Secondary | ICD-10-CM

## 2024-11-14 DIAGNOSIS — D649 Anemia, unspecified: Secondary | ICD-10-CM

## 2024-11-14 DIAGNOSIS — R102 Pelvic and perineal pain unspecified side: Secondary | ICD-10-CM

## 2024-11-14 DIAGNOSIS — R1032 Left lower quadrant pain: Secondary | ICD-10-CM | POA: Insufficient documentation

## 2024-11-14 DIAGNOSIS — Z758 Other problems related to medical facilities and other health care: Secondary | ICD-10-CM | POA: Diagnosis not present

## 2024-11-14 DIAGNOSIS — Z603 Acculturation difficulty: Secondary | ICD-10-CM | POA: Diagnosis not present

## 2024-11-14 DIAGNOSIS — K5909 Other constipation: Secondary | ICD-10-CM | POA: Diagnosis not present

## 2024-11-14 DIAGNOSIS — D509 Iron deficiency anemia, unspecified: Secondary | ICD-10-CM | POA: Insufficient documentation

## 2024-11-14 LAB — BASIC METABOLIC PANEL - CANCER CENTER ONLY
Anion gap: 10 (ref 5–15)
BUN: 17 mg/dL (ref 6–20)
CO2: 28 mmol/L (ref 22–32)
Calcium: 9.6 mg/dL (ref 8.9–10.3)
Chloride: 103 mmol/L (ref 98–111)
Creatinine: 1.02 mg/dL — ABNORMAL HIGH (ref 0.44–1.00)
GFR, Estimated: >60 mL/min
Glucose, Bld: 96 mg/dL (ref 70–99)
Potassium: 3.8 mmol/L (ref 3.5–5.1)
Sodium: 140 mmol/L (ref 135–145)

## 2024-11-14 LAB — CBC WITH DIFFERENTIAL (CANCER CENTER ONLY)
Abs Immature Granulocytes: 0.02 K/uL (ref 0.00–0.07)
Basophils Absolute: 0 K/uL (ref 0.0–0.1)
Basophils Relative: 0 %
Eosinophils Absolute: 0.1 K/uL (ref 0.0–0.5)
Eosinophils Relative: 1 %
HCT: 37.7 % (ref 36.0–46.0)
Hemoglobin: 12.6 g/dL (ref 12.0–15.0)
Immature Granulocytes: 1 %
Lymphocytes Relative: 26 %
Lymphs Abs: 0.9 K/uL (ref 0.7–4.0)
MCH: 28 pg (ref 26.0–34.0)
MCHC: 33.4 g/dL (ref 30.0–36.0)
MCV: 83.8 fL (ref 80.0–100.0)
Monocytes Absolute: 0.4 K/uL (ref 0.1–1.0)
Monocytes Relative: 12 %
Neutro Abs: 2.1 K/uL (ref 1.7–7.7)
Neutrophils Relative %: 60 %
Platelet Count: 166 K/uL (ref 150–400)
RBC: 4.5 MIL/uL (ref 3.87–5.11)
RDW: 14.9 % (ref 11.5–15.5)
WBC Count: 3.5 K/uL — ABNORMAL LOW (ref 4.0–10.5)
nRBC: 0 % (ref 0.0–0.2)

## 2024-11-14 LAB — IRON AND TIBC
Iron: 62 ug/dL (ref 28–170)
Saturation Ratios: 17 % (ref 10.4–31.8)
TIBC: 357 ug/dL (ref 250–450)
UIBC: 295 ug/dL

## 2024-11-14 LAB — FERRITIN: Ferritin: 46 ng/mL (ref 11–307)

## 2024-11-14 MED ORDER — IRON SUCROSE 20 MG/ML IV SOLN
200.0000 mg | Freq: Once | INTRAVENOUS | Status: AC
  Administered 2024-11-14: 200 mg via INTRAVENOUS
  Filled 2024-11-14: qty 10

## 2024-11-14 MED ORDER — BACLOFEN 10 MG PO TABS: 10.0000 mg | ORAL_TABLET | Freq: Two times a day (BID) | ORAL | 0 refills | Status: DC | PRN

## 2024-11-14 NOTE — Assessment & Plan Note (Addendum)
#   Anemia- Hb-symptomatic. From  iron  deficiency [march 2025- ferritin-5] - from etiology ?GI blood loss/menorrhagia. Not on PO iron  sec to constipation.    S/p  IV iron  infusion/Venofer -improved- but  proceed with venofer .   #Etiology of iron  deficiency: possible menstrual blood; loss- Hx  EGD-9 years [[facility]] /colonoscopy- s/p GI evaluation-  colonoscopy- 5/08-. HOLD off  capsule study/ CT scan abdomen pelvis- JAN 2025- NEG;   #Chronic constipation on linzess    # fatigue/drowsiness? Gabapentin [however many years]- defer to PCP.   # Left LLQ pain- degenerating fibroid- s/p GYN evaluation-    urine pregnancy test- Tubal ligation # DISPOSITION: # venofer  today- ok without urin pregnancy test-  # follow up  6 months-  MD; labs- cbc/bmp;iron  studies; ferritin;; possible venofer - Dr.B

## 2024-11-14 NOTE — Progress Notes (Signed)
 Declined post-observation. Aware of risks. Vitals stable at discharge.

## 2024-11-14 NOTE — Progress Notes (Unsigned)
 "   GYNECOLOGY VISIT  Patient name: Jaclyn Jackson MRN [id number]  Date of birth: 02-02-1981 Chief Complaint:   No chief complaint on file.  History:  Discussed the use of AI scribe software for clinical note transcription with the patient, who gave verbal consent to proceed.  History of Present Illness Jaclyn Jackson is a 44 year old female with fibroids who presents with worsening pelvic pain.  She has had pelvic pain for the past year, described as contraction-like, which has worsened since Wednesday to 8/10. Ibuprofen 800 mg and warm patches have not relieved the pain. She denies abnormal vaginal bleeding or discharge with the pain.  She has been amenorrheic since December. Her last period was not painful. When fibroids were first identified she had one two-week-long period, but since then her menses have been otherwise normal.  She had a tubal ligation 14 years ago and does not desire more children. She has frequent constipation but it is controlled with an over-the-counter product.   The following portions of the patient's history were reviewed and updated as appropriate: allergies, current medications, past family history, past medical history, past social history, past surgical history and problem list.   Health Maintenance:   Last pap     Component Value Date/Time   DIAGPAP  08/26/2024 0907    - Negative for intraepithelial lesion or malignancy (NILM)   HPVHIGH Negative 08/26/2024 0907   ADEQPAP  08/26/2024 0907    Satisfactory for evaluation; transformation zone component PRESENT.    Health Maintenance  Topic Date Due   HIV Screening  Never done   Hepatitis C Screening  Never done   DTaP/Tdap/Td vaccine (1 - Tdap) Never done   Pneumococcal Vaccine (1 of 2 - PCV) Never done   Hepatitis B Vaccine (1 of 3 - 19+ 3-dose series) Never done   HPV Vaccine (1 - Risk 3-dose SCDM series) Never done   Flu Shot  06/06/2024   COVID-19 Vaccine (3 - 2025-26 season) 07/07/2024    Breast Cancer Screening  06/10/2026   Pap with HPV screening  08/26/2029   Meningitis B Vaccine  Aged Out      Review of Systems:  Pertinent items are noted in HPI. Comprehensive review of systems was otherwise negative.   Objective:  Physical Exam BP 116/76   Pulse 78   Ht 5' 2 (1.575 m)   Wt 167 lb 6.4 oz (75.9 kg)   LMP 10/24/2024 (Exact Date)   BMI 30.62 kg/m    Physical Exam Vitals and nursing note reviewed.  Constitutional:      Appearance: Normal appearance.  HENT:     Head: Normocephalic and atraumatic.  Pulmonary:     Effort: Pulmonary effort is normal.  Abdominal:     Comments: + carnett  Genitourinary:    Comments: No allodynia Tenderness throughout the pelvic floor muscles Skin:    General: Skin is warm and dry.  Neurological:     General: No focal deficit present.     Mental Status: She is alert.  Psychiatric:        Mood and Affect: Mood normal.        Behavior: Behavior normal.        Thought Content: Thought content normal.        Judgment: Judgment normal.          Assessment & Plan:   Assessment & Plan Chronic pelvic pain due to pelvic floor dysfunction and uterine fibroids Chronic pelvic pain  likely due to pelvic floor dysfunction and uterine fibroids. Hysterectomy may not relieve pelvic floor-related pain and could worsen it. Non-surgical management recommended initially. - Prescribed muscle relaxers for pelvic floor muscle relaxation, caution advised for drowsiness. - Referred to pelvic floor physical therapy. - Scheduled trigger point injections. - Planning for potential future surgery based on response to non-surgical interventions. - Surgical scheduling referral placed and will reassess pain following PFPT sessions  Constipation Chronic constipation effectively managed with Lezest. - Continue Lezest for constipation.  In person interpreter used for encounter  Dejanae Helser, MD Minimally Invasive Gynecologic  [facility], [facility] "

## 2024-11-14 NOTE — Progress Notes (Signed)
 Fatigue/weakness: YES-PT STATES SHE IS SLEEPY ALL THE TIME Dyspena: NO  Light headedness: NO  Blood in stool: NO   C/o having lt ovary pain/fibroid, 10/10. She was seen at [facility] last night.

## 2024-11-14 NOTE — Addendum Note (Signed)
 Addended by: LAEL BROWNING A on: 11/14/2024 03:16 PM   Modules accepted: Orders

## 2024-11-14 NOTE — Progress Notes (Unsigned)
 Surgical consultation  Pt just left hospital in [place] at 01:00 this morning. She went due to the pain related to her fibroids. She was given some medications and a vaginal US .   Pt would like to discuss chances of fibroids returning following surgery.

## 2024-11-14 NOTE — Patient Instructions (Signed)
 Iron Sucrose Injection Qu es este medicamento? El HIERRO SACAROSA trata los niveles bajos de hierro (anemia por deficiencia de Company secretary) en personas con enfermedad renal. El hierro es un mineral que cumple una funcin importante en la produccin de glbulos rojos, que llevan el oxgeno de los pulmones al resto del cuerpo. Este medicamento puede ser utilizado para otros usos; si tiene alguna pregunta consulte con su proveedor de atencin mdica o con su farmacutico. MARCAS COMUNES: Venofer Qu le debo informar a mi profesional de la salud antes de tomar este medicamento? Necesitan saber si usted presenta alguno de los [employer] o situaciones: Anemia no causada por niveles bajos de hierro Careers information officer altos de hierro en la sangre Enfermedad renal Enfermedad heptica Una reaccin alrgica o inusual al hierro, a otros medicamentos, alimentos, colorantes o conservantes Si est embarazada o buscando quedar embarazada Si est amamantando a un beb Cmo debo Visual merchandiser medicamento? Este medicamento es para infusin en una vena. Su equipo de atencin lo [employer] en un hospital o en un entorno clnico. Hable con su equipo de atencin sobre el uso de este medicamento en nios. Aunque se puede recetar a nios tan pequeos como de 2 aos de edad con ciertas afecciones, existen precauciones que deben tomarse. Sobredosis: Pngase en contacto inmediatamente con un centro toxicolgico o una sala de urgencia si usted cree que haya tomado demasiado medicamento.<br>ATENCIN: [employer] es solo para usted. No comparta este medicamento con nadie. Qu sucede si me olvido de una dosis? Cumpla con las citas para dosis de seguimiento. Es importante no olvidar ninguna dosis. Llame a su equipo de atencin si no puede asistir a una cita. Qu puede interactuar con este medicamento? No use este medicamento con ninguno de los siguientes productos: Deferoxamina Dimercaprol Otros  productos con hierro Industrial/product designer tambin podra Product/process development scientist con los siguientes productos: Cloranfenicol Deferasirox Puede ser que esta lista no menciona todas las posibles interacciones. Informe a su profesional de [employer] de [employer] productos a base de hierbas, medicamentos de [place] o suplementos nutritivos que est tomando. Si usted fuma, consume bebidas alcohlicas o si utiliza drogas ilegales, indqueselo tambin a su profesional de [employer]. Algunas sustancias pueden interactuar con su medicamento. A qu debo estar atento al usar [employer]? Visite a su equipo de atencin para que revise su evolucin peridicamente. Informe a su equipo de atencin si los sntomas no comienzan a mejorar o si empeoran. Usted podra necesitar realizarse [employer] de sangre mientras est usando [place]. Es posible que deba comer ms alimentos que contienen hierro. Hable con su equipo de atencin. Los alimentos que contienen hierro incluyen granos o cereales enteros, frutas secas, legumbres, guisantes, verduras de hojas verdes y rganos internos (hgado, rin). Qu efectos secundarios puedo tener al [employer] este medicamento? Efectos secundarios que debe informar a su equipo de atencin tan pronto como sea posible: Reacciones alrgicas: erupcin cutnea, comezn/picazn, urticaria, hinchazn de la cara, los labios, la lengua o la garganta Presin arterial baja: mareo, sensacin de desmayo o aturdimiento, visin borrosa Falta de aliento Efectos secundarios que generalmente no requieren atencin mdica (debe informarlos a su equipo de atencin si persisten o si son molestos): Enrojecimiento Dolor de Public house manager en las articulaciones Dolor muscular Therapist, sports, enrojecimiento o Marketing executive de la inyeccin Puede ser que esta lista no menciona todos los posibles efectos secundarios. Comunquese a su mdico por asesoramiento mdico [employer]. Usted puede  informar los efectos secundarios a Technical brewer  por telfono al 1-800-FDA-1088. Dnde debo guardar mi medicina? Este medicamento se administra en hospitales o clnicas. No se guarda en su casa. ATENCIN: Este folleto es un resumen. Puede ser que no cubra toda la posible informacin. Si usted tiene preguntas acerca de esta medicina, consulte con su mdico, su farmacutico o su profesional de Radiographer, therapeutic.  2024 Elsevier/Gold Standard (2023-08-20 00:00:00)

## 2024-11-14 NOTE — Progress Notes (Signed)
 [facility] CONSULT NOTE  Patient Care Team: Revelo, Yancy Roof, MD as PCP - General (Family Medicine) Darliss Rogue, MD as PCP - Cardiology (Cardiology) Rennie Cindy SAUNDERS, MD as Consulting Physician (Oncology)  CHIEF COMPLAINTS/PURPOSE OF CONSULTATION: ANEMIA   HEMATOLOGY HISTORY  # ANEMIA[Hb; MCV-platelets- WBC; Iron  sat; ferritin;  GFR- CT/US - ;   HISTORY OF PRESENTING ILLNESS:  Patient ambulating-independently. Alone.  Jaclyn Jackson 44 y.o.  female pleasant patient iron  deficiency anemia likely secondary to menstrual cycles here for follow-up.  Discussed the use of AI scribe software for clinical note transcription with the patient, who gave verbal consent to proceed.  History of Present Illness   Jaclyn Jackson is a 44 year old female with iron  deficiency anemia and uterine fibroid who presents for follow-up of anemia management.  She has ongoing symptomatic iron  deficiency anemia, previously requiring iron  infusions. Her hemoglobin was 11.7 g/dL at her last visit and is 12.6 g/dL today. She continues to experience fatigue and drowsiness and inquires whether these symptoms are attributable to anemia. Her white blood cell count is 3.5 x10^9/L.  She developed left-sided abdominal pain beginning last Wednesday. CT imaging revealed a uterine fibroid. She was evaluated by gynecology earlier today and is scheduled to receive therapy and an injection for the fibroid.  Her last menstrual cycle was normal in duration and volume. She was prescribed oxycodone  for pain and took her first dose yesterday. She also takes gabapentin 300 mg in the morning and 600 mg at night for migraine, which she has used for several years. She continues to report persistent drowsiness and fatigue.     Review of Systems  Constitutional:  Positive for malaise/fatigue. Negative for chills, diaphoresis, fever and weight loss.  HENT:  Negative for nosebleeds and sore throat.    Eyes:  Negative for double vision.  Respiratory:  Negative for cough, hemoptysis, sputum production and wheezing.   Cardiovascular:  Negative for chest pain, palpitations, orthopnea and leg swelling.  Gastrointestinal:  Negative for abdominal pain, blood in stool, constipation, diarrhea, heartburn, melena, nausea and vomiting.  Genitourinary:  Negative for dysuria, frequency and urgency.  Musculoskeletal:  Negative for back pain and joint pain.  Skin: Negative.  Negative for itching and rash.  Neurological:  Negative for dizziness, tingling, focal weakness, weakness and headaches.  Endo/Heme/Allergies:  Does not bruise/bleed easily.  Psychiatric/Behavioral:  Negative for depression. The patient is not nervous/anxious and does not have insomnia.    MEDICAL HISTORY:  Past Medical History:  Diagnosis Date   Anemia    Fibroid    Headache    Osteoporosis    Ovarian cyst    Photophobia     SURGICAL HISTORY: Past Surgical History:  Procedure Laterality Date   ABDOMINAL SURGERY     CESAREAN SECTION     COLONOSCOPY N/A 03/13/2024   Procedure: COLONOSCOPY;  Surgeon: Onita Elspeth Sharper, DO;  Location: [facility] ENDOSCOPY;  Service: Gastroenterology;  Laterality: N/A;   COLONOSCOPY N/A 04/24/2024   Procedure: COLONOSCOPY;  Surgeon: Onita Elspeth Sharper, DO;  Location: [facility] ENDOSCOPY;  Service: Gastroenterology;  Laterality: N/A;  SPANISH INTERPRETER   ESOPHAGOGASTRODUODENOSCOPY N/A 03/13/2024   Procedure: EGD (ESOPHAGOGASTRODUODENOSCOPY);  Surgeon: Onita Elspeth Sharper, DO;  Location[facility] ENDOSCOPY;  Service: Gastroenterology;  Laterality: N/A;   TUBAL LIGATION      SOCIAL HISTORY: Social History   Socioeconomic History   Marital status: Married    Spouse name: Not on file   Number of children: 3   Years of education:  Not on file   Highest education level: Not on file  Occupational History   Not on file  Tobacco Use   Smoking status: Never   Smokeless tobacco: Never  Vaping Use    Vaping status: Never Used  Substance and Sexual Activity   Alcohol use: No   Drug use: Never   Sexual activity: Yes    Birth control/protection: None    Comment: tubal ligation  Other Topics Concern   Not on file  Social History Narrative   Family: mat great aunt- colon cancer in 27 ys.. [place]. No smoking or alcohol. [employer] aware house.    Social Drivers of Health   Tobacco Use: Low Risk (11/14/2024)   Patient History    Smoking Tobacco Use: Never    Smokeless Tobacco Use: Never    Passive Exposure: Not on file  Financial Resource Strain: Low Risk  (05/21/2024)   Received from [facility]   Overall Financial Resource Strain (CARDIA)    Difficulty of Paying Living Expenses: Not hard at all  Food Insecurity: No Food Insecurity (05/21/2024)   Received from [facility]   Epic    Within the past 12 months, you worried that your food would run out before you got the money to buy more.: Never true    Within the past 12 months, the food you bought just didn't last and you didn't have money to get more.: Never true  Transportation Needs: No Transportation Needs (05/21/2024)   Received [facility] - Transportation    In the past 12 months, has lack of transportation kept you from medical appointments or from getting medications?: No    Lack of Transportation (Non-Medical): No  Physical Activity: Not on file  Stress: Not on file  Social Connections: Not on file  Intimate Partner Violence: Not At Risk (01/10/2024)   Humiliation, Afraid, Rape, and Kick questionnaire    Fear of Current or Ex-Partner: No    Emotionally Abused: No    Physically Abused: No    Sexually Abused: No  Depression (PHQ2-9): Low Risk (11/14/2024)   Depression (PHQ2-9)    PHQ-2 Score: 0  Alcohol Screen: Not on file  Housing: Low Risk  (05/21/2024)   Received from [facility]   Epic    In the last 12 months, was there a time when  you were not able to pay the mortgage or rent on time?: No    In the past 12 months, how many times have you moved where you were living?: 0    At any time in the past 12 months, were you homeless or living in a shelter (including now)?: No  Utilities: Not At Risk (05/21/2024)   Received from [facility]   Epic    In the past 12 months has the electric, gas, oil, or water company threatened to shut off services in your home?: No  Health Literacy: Not on file    FAMILY HISTORY: Family History  Problem Relation Age of Onset   Fibroids Paternal Grandmother    Heart attack Paternal Grandmother    Diabetes Maternal Grandmother    Heart disease Father    Hypertension Mother    Cancer Maternal Aunt    Colon cancer Maternal Aunt    Diabetes Maternal Uncle    Breast cancer Neg Hx     ALLERGIES:  is allergic to acetaminophen  and hydrocodone -acetaminophen .  MEDICATIONS:  Current  Outpatient Medications  Medication Sig Dispense Refill   cyclobenzaprine  (FLEXERIL ) 10 MG tablet Take 1 tablet (10 mg total) by mouth at bedtime. 10 tablet 0   gabapentin (NEURONTIN) 300 MG capsule Take 300 mg in the morning and  600 mg at night     ibuprofen (ADVIL) 800 MG tablet Take 800 mg by mouth.     linaclotide (LINZESS) 145 MCG CAPS capsule Take 145 mcg by mouth daily before breakfast.     naproxen (NAPROSYN) 500 MG tablet Take 500 mg by mouth 2 (two) times daily as needed.     nortriptyline (PAMELOR) 10 MG capsule Take 20 mg by mouth daily. Take 20 mg qam and 50 mg at bedtime (Patient taking differently: Take 25 mg by mouth daily. Take 25 mg qam and 75 mg at bedtime)     oxyCODONE  (OXY IR/ROXICODONE ) 5 MG immediate release tablet Take 5 mg by mouth every 4 (four) hours as needed for severe pain (pain score 7-10).     pantoprazole (PROTONIX) 40 MG tablet TAKE 1 TABLET (40 MG TOTAL) BY MOUTH ONCE DAILY TAKE 15-20 MINS BEFORE MEAL     Vitamin D, Ergocalciferol, (DRISDOL) 1.25 MG (50000 UNIT)  CAPS capsule Take 50,000 Units by mouth every 7 (seven) days.     medroxyPROGESTERone  (PROVERA ) 10 MG tablet Take 1 tablet (10 mg total) by mouth every 12 (twelve) hours. (Patient not taking: Reported on 11/14/2024) 180 tablet 1   No current facility-administered medications for this visit.     PHYSICAL EXAMINATION:   Vitals:   11/14/24 1425  BP: 120/82  Pulse: 76  Resp: 18  Temp: 98.6 F (37 C)  SpO2: 100%   Filed Weights   11/14/24 1425  Weight: 169 lb 6.4 oz (76.8 kg)    Physical Exam Vitals and nursing note reviewed.  HENT:     Head: Normocephalic and atraumatic.     Mouth/Throat:     Pharynx: Oropharynx is clear.  Eyes:     Extraocular Movements: Extraocular movements intact.     Pupils: Pupils are equal, round, and reactive to light.  Cardiovascular:     Rate and Rhythm: Normal rate and regular rhythm.  Pulmonary:     Comments: Decreased breath sounds bilaterally.  Abdominal:     Palpations: Abdomen is soft.  Musculoskeletal:        General: Normal range of motion.     Cervical back: Normal range of motion.  Skin:    General: Skin is warm.  Neurological:     General: No focal deficit present.     Mental Status: She is alert and oriented to person, place, and time.  Psychiatric:        Behavior: Behavior normal.        Judgment: Judgment normal.      LABORATORY DATA:  I have reviewed the data as listed Lab Results  Component Value Date   WBC 3.5 (L) 11/14/2024   HGB 12.6 11/14/2024   HCT 37.7 11/14/2024   MCV 83.8 11/14/2024   PLT 166 11/14/2024   Recent Labs    01/10/24 1158 03/12/24 0917 07/11/24 1528 07/16/24 1233 11/14/24 1425  NA 138   < > 137 137 140  K 3.6   < > 4.1 3.8 3.8  CL 105   < > 102 104 103  CO2 25   < > 27 25 28   GLUCOSE 99   < > 91 88 96  BUN 19   < > 25* 18  17  CREATININE 0.84   < > 0.78 0.82 1.02*  CALCIUM 9.1   < > 9.1 9.0 9.6  GFRNONAA >60   < > >60 >60 >60  PROT 7.6  --   --   --   --   ALBUMIN 4.1  --   --   --    --   AST 27  --   --   --   --   ALT 23  --   --   --   --   ALKPHOS 60  --   --   --   --   BILITOT 0.5  --   --   --   --    < > = values in this interval not displayed.     No results found.    ASSESSMENT & PLAN:   Symptomatic anemia # Anemia- Hb-symptomatic. From  iron  deficiency [march 2025- ferritin-5] - from etiology ?GI blood loss/menorrhagia. Not on PO iron  sec to constipation.    S/p  IV iron  infusion/Venofer -improved- but  proceed with venofer .   #Etiology of iron  deficiency: possible menstrual blood; loss- Hx  EGD-9 years [[facility]] /colonoscopy- s/p GI evaluation-  colonoscopy- 5/08-. HOLD off  capsule study/ CT scan abdomen pelvis- JAN 2025- NEG;   #Chronic constipation on linzess    # fatigue/drowsiness? Gabapentin [however many years]- defer to PCP.   # Left LLQ pain- degenerating fibroid- s/p GYN evaluation-    urine pregnancy test- Tubal ligation # DISPOSITION: # venofer  today- ok without urin pregnancy test-  # follow up  6 months-  MD; labs- cbc/bmp;iron  studies; ferritin;; possible venofer - Dr.B    Cindy JONELLE Joe, MD 11/14/2024 3:03 PM

## 2024-11-17 ENCOUNTER — Telehealth: Payer: Self-pay

## 2024-11-17 NOTE — Telephone Encounter (Signed)
 I called patient to see if she's available for surgery w/ Dr. Jeralyn on 11/27/24. No answer, recording stated, to hang up and try the call later.

## 2024-11-26 ENCOUNTER — Telehealth: Payer: Self-pay

## 2024-11-26 ENCOUNTER — Other Ambulatory Visit: Payer: Self-pay | Admitting: Obstetrics and Gynecology

## 2024-11-26 DIAGNOSIS — R102 Pelvic and perineal pain unspecified side: Secondary | ICD-10-CM

## 2024-11-26 NOTE — Telephone Encounter (Signed)
 I called patient to see if she's available for surgery w/ Dr. Jeralyn on 12/29/24. Recording stated my call could not be completed at this time.

## 2024-12-04 ENCOUNTER — Telehealth: Payer: Self-pay

## 2024-12-04 DIAGNOSIS — R102 Pelvic and perineal pain unspecified side: Secondary | ICD-10-CM

## 2024-12-04 NOTE — Addendum Note (Signed)
 Addended by: Leianna Barga, SOLA O on: 12/04/2024 04:21 PM   Modules accepted: Orders

## 2024-12-04 NOTE — Telephone Encounter (Signed)
 I used interpreter services(ID# [id number] Milajros) to call patient to see if she's available for surgery w/ Dr. Jeralyn on 12/29/24 at 7:30 am. Patient agreed to surgery date and details. Patient would like a full TLH instead of partial. I let her know I would send the details to Dr. Jeralyn. Confirmed I will mail surgery details.

## 2024-12-10 ENCOUNTER — Other Ambulatory Visit: Payer: Self-pay | Admitting: Nurse Practitioner

## 2024-12-10 DIAGNOSIS — N63 Unspecified lump in unspecified breast: Secondary | ICD-10-CM

## 2024-12-17 ENCOUNTER — Ambulatory Visit: Payer: Self-pay | Admitting: Obstetrics and Gynecology

## 2024-12-23 ENCOUNTER — Other Ambulatory Visit

## 2024-12-29 ENCOUNTER — Ambulatory Visit (HOSPITAL_COMMUNITY): Admit: 2024-12-29 | Admitting: Obstetrics and Gynecology

## 2025-05-15 ENCOUNTER — Inpatient Hospital Stay: Admitting: Internal Medicine

## 2025-05-15 ENCOUNTER — Inpatient Hospital Stay
